# Patient Record
Sex: Male | Born: 1960 | Race: White | Hispanic: No | Marital: Single | Smoking: Current every day smoker
Health system: Southern US, Community
[De-identification: ages and names within clinical notes are randomized; demographics above are authoritative.]

## PROBLEM LIST (undated history)

## (undated) DIAGNOSIS — I350 Nonrheumatic aortic (valve) stenosis: Secondary | ICD-10-CM

## (undated) DIAGNOSIS — R0902 Hypoxemia: Secondary | ICD-10-CM

## (undated) DIAGNOSIS — R0602 Shortness of breath: Secondary | ICD-10-CM

## (undated) DIAGNOSIS — J189 Pneumonia, unspecified organism: Secondary | ICD-10-CM

## (undated) HISTORY — PX: NO PAST SURGERIES: SHX2092

## (undated) HISTORY — DX: Pneumonia, unspecified organism: J18.9

## (undated) HISTORY — PX: APPENDECTOMY: SHX54

## (undated) HISTORY — DX: Nonrheumatic aortic (valve) stenosis: I35.0

## (undated) HISTORY — DX: Hypoxemia: R09.02

## (undated) HISTORY — PX: STENT PLACEMENT VASCULAR (ARMC HX): HXRAD1737

## (undated) HISTORY — PX: OTHER SURGICAL HISTORY: SHX169

## (undated) HISTORY — DX: Shortness of breath: R06.02

## (undated) HISTORY — PX: KNEE SURGERY: SHX244

---

## 2005-05-03 ENCOUNTER — Encounter: Admission: RE | Admit: 2005-05-03 | Discharge: 2005-08-01 | Payer: Self-pay | Admitting: Orthopedic Surgery

## 2008-12-21 ENCOUNTER — Ambulatory Visit: Payer: Self-pay | Admitting: Cardiovascular Disease

## 2009-03-13 ENCOUNTER — Ambulatory Visit: Payer: Self-pay | Admitting: Cardiovascular Disease

## 2009-03-13 ENCOUNTER — Encounter: Payer: Self-pay | Admitting: Cardiovascular Disease

## 2009-03-13 DIAGNOSIS — I25110 Atherosclerotic heart disease of native coronary artery with unstable angina pectoris: Secondary | ICD-10-CM

## 2009-03-13 DIAGNOSIS — I2510 Atherosclerotic heart disease of native coronary artery without angina pectoris: Secondary | ICD-10-CM

## 2009-03-13 DIAGNOSIS — I10 Essential (primary) hypertension: Secondary | ICD-10-CM

## 2009-03-13 HISTORY — DX: Essential (primary) hypertension: I10

## 2009-03-13 HISTORY — DX: Atherosclerotic heart disease of native coronary artery with unstable angina pectoris: I25.110

## 2009-03-13 HISTORY — DX: Atherosclerotic heart disease of native coronary artery without angina pectoris: I25.10

## 2011-04-16 NOTE — Assessment & Plan Note (Signed)
[facility]                            CARDIOLOGY OFFICE NOTE   RUSH, SALCE                       MRN:          [id number]  DATE:12/21/2008                            DOB:          November 02, 1961    PRIMARY CARE PHYSICIAN:  Dr. Donnel Saxon.   PRIMARY CARDIOLOGIST:  Dr. Gypsy Balsam.   REASON FOR REFERRAL:  Patient with known coronary artery disease with  continued exertional and resting chest pain.   HISTORY OF PRESENT ILLNESS:  Mr. Hayashi is a pleasant 50 year old  Caucasian male with a past medical history significant for coronary  artery disease status post an acute myocardial infarction in October  2007, at which time, he received a Vision bare-metal stent in the mid  circumflex artery, hypertension, hyperlipidemia, GERD, and chronic back  pain, who presents today to our office for further evaluation of  substernal chest pain.  The patient is a very poor historian and is  unaware of most of his medications as well as most of his past medical  history.  I have reviewed the records that arrived with the patient from  Dr. Ardelle Park and it appears that he has been having daily exertional chest  pain for several years.  The patient tells me that in October 2007, he  was taken emergently to the [facility] with  chest pain and was found to be having a heart attack.  He was taken  emergently to the catheterization laboratory and had a stent placed.  I  was able to retrieve his stent card from the patient today that shows  that he had a Vision bare-metal stent placed in the mid circumflex  artery on September 23, 2006.  Since that time, the patient has had  complaints of daily substernal chest pain that is described as a  pressure-like sensation and occurs both at rest and with exertion.  The  pain does not radiate; however, when he has the pain, he become sweaty,  notices shortness of breath, and has numbness in both of his  arms.  He  denies any nausea or palpitations during the episodes of chest pain.  He  does not seem to have significant resting or exertional dyspnea.  He  denies any orthopnea, PND, or lower extremity edema.  He unfortunately  tells me that he has been smoking since he was 50 years old and continues  to smoke 1-1/2-2 packs of cigarettes per day.  He reports being  compliant with all his medications, however today he is not sure what  medications he is taking.  He did not bring any of his medications with  him to this appointment today.   The patient tells me that since his stent was placed in 2007, he has had  these episodes of chest pain on an almost daily basis.  It seems that he  has had a repeat left heart catheterization at some point in the last  year and was told at that time that there was no significant coronary  artery disease.  The patient reports also having a normal  stress test at  some point in the last several years.  He also has had a workup for  potential gallbladder disease that has returned as negative per the  patient and has been treated recently for gastroesophageal reflux  disease as a possible source of his chest pain.  He has seen no  significant change in his chest pain since starting the Zantac 2 months  ago.  The patient is followed apparently by Dr. Gypsy Balsam in  [facility] in [place] and had his stent placed by Dr. Janene Madeira in [facility].  The patient is unsure if he has been  seen recently by Dr. Carollee Sires, but reports having been seen in the last  6 months to a year in the office of Dr. Bing Matter.  The patient thinks  that these physicians have told him that his chest pain may be related  to coronary artery vasospasm; however, he is unsure of this.  He thinks  that he has been taking Imdur 120 mg once daily for this problem.   PAST MEDICAL HISTORY:  1. Coronary artery disease status post acute myocardial infarction in       October 2007 with placement of a Vision bare-metal stent in the mid      circumflex artery.  No records of this catheterization are      available at the current time.  2. Hypertension.  3. Hyperlipidemia.  4. GERD.  5. Chronic back pain.  6. Chronic tobacco abuse.   PAST SURGICAL HISTORY:  1. Repair of left femur fracture.  2. Appendectomy.   ALLERGIES:  No known drug allergies.   The patient reports that if he takes 325 mg of aspirin, he has a  nosebleed.  He apparently tolerates 81 mg of aspirin and takes this on a  daily basis without any nose bleeding or allergic response.   CURRENT MEDICATIONS:  We have contacted the patient's pharmacy and  founded that he is taking hydrocodone 10/500 mg every 6 hours, diazepam  10 mg 3 times daily, Zantac 300 mg once daily, isosorbide mononitrate  120 mg once daily, aspirin 81 mg once daily.  The patient also reports  taking a medication for his blood pressure and one for his cholesterol,  but he is unsure of what these medications are.   SOCIAL HISTORY:  The patient has smoked 1-1/2-2 packs of cigarettes per  day for the last 42 years.  He denies use alcohol or illicit drugs.  He  is currently unemployed.  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  He is separated from his spouse and has no children.   FAMILY HISTORY:  The patient's mother is alive, but reportedly has  coronary artery disease.  His father is deceased and reportedly had  coronary artery disease.   REVIEW OF SYSTEMS:  As stated in history of present illness and is  otherwise negative.   PHYSICAL EXAMINATION:  VITAL SIGNS:  Blood pressure 158/80, pulse 68 and  regular, respirations 12 and unlabored.  GENERAL:  He is a pleasant, thin Caucasian male in no acute distress.  He is alert and oriented x3.  PSYCHIATRIC:  Mood and affect are appropriate.  MUSCULOSKELETAL:  Muscle strength and tone is normal.  NEUROLOGIC:  No focal neurological deficits.  SKIN:   Warm and dry.  HEENT:  Normal.  NECK:  No JVD.  No carotid bruits.  No thyromegaly.  No lymphadenopathy.  LUNGS:  Clear to auscultation  bilaterally without wheezes, rhonchi, or  crackles noted.  CARDIOVASCULAR:  Regular rate and rhythm without murmurs, gallops, or  rubs noted.  ABDOMEN:  Soft, nontender, nondistended.  Bowel sounds are present.  EXTREMITIES:  No evidence of edema.  Pulses are 2+ in all extremities.   DIAGNOSTIC STUDIES:  1. A 12-lead EKG obtained in our office today show normal sinus rhythm      with a ventricular rate of 75 beats per minute.  There are no      ischemic changes noted.  2. Records of the patient's prior heart catheterizations and stress      tests are not available at this time.  We will have the patient      sign release of information forms in attempts to obtain these      records.   ASSESSMENT AND PLAN:  This is a pleasant 50 year old Caucasian male with  known coronary artery disease status post prior myocardial infarction  with placement of a bare-metal stent in the mid circumflex artery in  2007, hypertension, hyperlipidemia, GERD, and chronic back pain, who  presents for evaluation for daily exertional and resting substernal  chest pain.  The patient's chest pain has many atypical features;  however, he does have known coronary artery disease.  At this time, it  is unclear to me what has been done for him recently.  It sounds as if  he has had a recent normal stress test and normal heart catheterization.  The other physicians who have evaluated him have suspected that his  chest pain is related to a gastrointestinal problem such as GERD or is  secondary to coronary vasospasm.  His EKG today is not suggestive of any  active ischemia.  At this time, I do not have an accurate list of his  current medications or any of the prior records of his cardiac workup.  We have gotten the patient to sign a medical release forms from Dr.  Gypsy Balsam in  [facility] in [place] as well as for Dr.  Janene Madeira in [facility] in [place].  We will have  all of the records of prior cardiac workups sent to our office if  possible and will see the patient back in 3-4 weeks to review these  records and to decide on further workup at that time.  I have also  encouraged the patient to attempt to stop smoking and I have asked him  to bring all of his medication bottles with him during the next  appointment, so we can accurately chart these in our records.  I will  not make any changes in his medications at this time because I am really  unsure of what he is taking currently.  The patient is alerted that he  should call [facility] if he has any change in his  clinical status.     Verne Carrow, MD  Electronically Signed    CM/MedQ  DD: 12/21/2008  DT: 12/22/2008  Job #: [id number]   cc:   Donnel Saxon

## 2015-05-03 DIAGNOSIS — E7800 Pure hypercholesterolemia, unspecified: Secondary | ICD-10-CM | POA: Insufficient documentation

## 2015-05-03 HISTORY — DX: Pure hypercholesterolemia, unspecified: E78.00

## 2015-06-26 ENCOUNTER — Emergency Department (HOSPITAL_COMMUNITY): Payer: Medicaid Other

## 2015-06-26 ENCOUNTER — Encounter (HOSPITAL_COMMUNITY): Payer: Self-pay | Admitting: *Deleted

## 2015-06-26 ENCOUNTER — Inpatient Hospital Stay (HOSPITAL_COMMUNITY)
Admission: EM | Admit: 2015-06-26 | Discharge: 2015-07-02 | DRG: 183 | Disposition: A | Discharge status: Home Health | Payer: Medicaid Other | Attending: Internal Medicine | Admitting: Internal Medicine

## 2015-06-26 ENCOUNTER — Other Ambulatory Visit: Payer: Self-pay

## 2015-06-26 DIAGNOSIS — S2242XA Multiple fractures of ribs, left side, initial encounter for closed fracture: Principal | ICD-10-CM | POA: Diagnosis present

## 2015-06-26 DIAGNOSIS — J9811 Atelectasis: Secondary | ICD-10-CM | POA: Diagnosis not present

## 2015-06-26 DIAGNOSIS — S2249XA Multiple fractures of ribs, unspecified side, initial encounter for closed fracture: Secondary | ICD-10-CM

## 2015-06-26 DIAGNOSIS — R0989 Other specified symptoms and signs involving the circulatory and respiratory systems: Secondary | ICD-10-CM

## 2015-06-26 DIAGNOSIS — I209 Angina pectoris, unspecified: Secondary | ICD-10-CM | POA: Diagnosis present

## 2015-06-26 DIAGNOSIS — Z7902 Long term (current) use of antithrombotics/antiplatelets: Secondary | ICD-10-CM

## 2015-06-26 DIAGNOSIS — T1490XA Injury, unspecified, initial encounter: Secondary | ICD-10-CM

## 2015-06-26 DIAGNOSIS — I10 Essential (primary) hypertension: Secondary | ICD-10-CM | POA: Diagnosis present

## 2015-06-26 DIAGNOSIS — I5032 Chronic diastolic (congestive) heart failure: Secondary | ICD-10-CM | POA: Diagnosis present

## 2015-06-26 DIAGNOSIS — R0789 Other chest pain: Secondary | ICD-10-CM | POA: Diagnosis present

## 2015-06-26 DIAGNOSIS — R079 Chest pain, unspecified: Secondary | ICD-10-CM | POA: Diagnosis present

## 2015-06-26 DIAGNOSIS — J189 Pneumonia, unspecified organism: Secondary | ICD-10-CM | POA: Insufficient documentation

## 2015-06-26 DIAGNOSIS — E785 Hyperlipidemia, unspecified: Secondary | ICD-10-CM | POA: Diagnosis present

## 2015-06-26 DIAGNOSIS — K5900 Constipation, unspecified: Secondary | ICD-10-CM | POA: Diagnosis present

## 2015-06-26 DIAGNOSIS — Y92410 Unspecified street and highway as the place of occurrence of the external cause: Secondary | ICD-10-CM

## 2015-06-26 DIAGNOSIS — R0902 Hypoxemia: Secondary | ICD-10-CM | POA: Insufficient documentation

## 2015-06-26 DIAGNOSIS — Z79899 Other long term (current) drug therapy: Secondary | ICD-10-CM

## 2015-06-26 DIAGNOSIS — J9600 Acute respiratory failure, unspecified whether with hypoxia or hypercapnia: Secondary | ICD-10-CM | POA: Diagnosis present

## 2015-06-26 DIAGNOSIS — J9601 Acute respiratory failure with hypoxia: Secondary | ICD-10-CM | POA: Diagnosis present

## 2015-06-26 DIAGNOSIS — D72829 Elevated white blood cell count, unspecified: Secondary | ICD-10-CM | POA: Diagnosis present

## 2015-06-26 DIAGNOSIS — S2239XA Fracture of one rib, unspecified side, initial encounter for closed fracture: Secondary | ICD-10-CM | POA: Diagnosis present

## 2015-06-26 DIAGNOSIS — S2232XA Fracture of one rib, left side, initial encounter for closed fracture: Secondary | ICD-10-CM | POA: Diagnosis present

## 2015-06-26 DIAGNOSIS — J181 Lobar pneumonia, unspecified organism: Secondary | ICD-10-CM

## 2015-06-26 DIAGNOSIS — R0602 Shortness of breath: Secondary | ICD-10-CM | POA: Insufficient documentation

## 2015-06-26 DIAGNOSIS — I2510 Atherosclerotic heart disease of native coronary artery without angina pectoris: Secondary | ICD-10-CM | POA: Diagnosis present

## 2015-06-26 HISTORY — DX: Multiple fractures of ribs, unspecified side, initial encounter for closed fracture: S22.49XA

## 2015-06-26 HISTORY — DX: Chest pain, unspecified: R07.9

## 2015-06-26 HISTORY — DX: Other chest pain: R07.89

## 2015-06-26 LAB — ETHANOL: Alcohol, Ethyl (B): <5 mg/dL (ref <5)

## 2015-06-26 LAB — COMPREHENSIVE METABOLIC PANEL
ALBUMIN: 4.2 g/dL (ref 3.5–5.0)
ALT: 32 U/L (ref 17–63)
AST: 34 U/L (ref 15–41)
Alkaline Phosphatase: 72 U/L (ref 38–126)
Anion gap: 7 (ref 5–15)
BILIRUBIN TOTAL: 0.8 mg/dL (ref 0.3–1.2)
BUN: 20 mg/dL (ref 6–20)
CHLORIDE: 110 mmol/L (ref 101–111)
CO2: 25 mmol/L (ref 22–32)
Calcium: 9.3 mg/dL (ref 8.9–10.3)
Creatinine, Ser: 1.01 mg/dL (ref 0.61–1.24)
GFR calc Af Amer: >60 mL/min (ref >60)
GFR calc non Af Amer: >60 mL/min (ref >60)
Glucose, Bld: 106 mg/dL — ABNORMAL HIGH (ref 65–99)
POTASSIUM: 3.9 mmol/L (ref 3.5–5.1)
SODIUM: 142 mmol/L (ref 135–145)
Total Protein: 6.9 g/dL (ref 6.5–8.1)

## 2015-06-26 LAB — CBC
HCT: 45.1 % (ref 39.0–52.0)
HEMOGLOBIN: 15.4 g/dL (ref 13.0–17.0)
MCH: 29.2 pg (ref 26.0–34.0)
MCHC: 34.1 g/dL (ref 30.0–36.0)
MCV: 85.6 fL (ref 78.0–100.0)
Platelets: 176 10*3/uL (ref 150–400)
RBC: 5.27 MIL/uL (ref 4.22–5.81)
RDW: 13.9 % (ref 11.5–15.5)
WBC: 13.4 10*3/uL — ABNORMAL HIGH (ref 4.0–10.5)

## 2015-06-26 LAB — PROTIME-INR
INR: 1.04 (ref 0.00–1.49)
Prothrombin Time: 13.8 seconds (ref 11.6–15.2)

## 2015-06-26 LAB — TROPONIN I
Troponin I: <0.03 ng/mL (ref <0.031)
Troponin I: <0.03 ng/mL (ref <0.031)

## 2015-06-26 LAB — SAMPLE TO BLOOD BANK

## 2015-06-26 LAB — CDS SEROLOGY

## 2015-06-26 MED ORDER — ONDANSETRON HCL 4 MG/2ML IJ SOLN
4.0000 mg | Freq: Once | INTRAMUSCULAR | Status: AC
  Administered 2015-06-26: 4 mg via INTRAVENOUS
  Filled 2015-06-26: qty 2

## 2015-06-26 MED ORDER — IOHEXOL 300 MG/ML  SOLN
100.0000 mL | Freq: Once | INTRAMUSCULAR | Status: AC | PRN
  Administered 2015-06-26: 100 mL via INTRAVENOUS

## 2015-06-26 MED ORDER — HYDROMORPHONE HCL 1 MG/ML IJ SOLN
1.0000 mg | Freq: Once | INTRAMUSCULAR | Status: AC
  Administered 2015-06-26: 1 mg via INTRAVENOUS
  Filled 2015-06-26: qty 1

## 2015-06-26 MED ORDER — ASPIRIN 81 MG PO CHEW
324.0000 mg | CHEWABLE_TABLET | Freq: Once | ORAL | Status: AC
  Administered 2015-06-26: 324 mg via ORAL
  Filled 2015-06-26: qty 4

## 2015-06-26 MED ORDER — HYDROCODONE-ACETAMINOPHEN 5-325 MG PO TABS
1.0000 | ORAL_TABLET | Freq: Four times a day (QID) | ORAL | Status: DC | PRN
  Administered 2015-06-26: 1 via ORAL
  Filled 2015-06-26: qty 1

## 2015-06-26 NOTE — ED Notes (Signed)
Pt back from x-ray.
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
## 2015-06-26 NOTE — ED Notes (Signed)
Pt repositioned in bed and provided with a [place] sandwich bag and Coke to drink, okay'd by MD
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
## 2015-06-26 NOTE — ED Notes (Signed)
Phlebotomy at bedside.
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
## 2015-06-27 ENCOUNTER — Encounter (HOSPITAL_COMMUNITY): Payer: Self-pay | Admitting: *Deleted

## 2015-06-27 DIAGNOSIS — S2232XA Fracture of one rib, left side, initial encounter for closed fracture: Secondary | ICD-10-CM

## 2015-06-27 DIAGNOSIS — R079 Chest pain, unspecified: Secondary | ICD-10-CM | POA: Diagnosis not present

## 2015-06-27 HISTORY — DX: Fracture of one rib, left side, initial encounter for closed fracture: S22.32XA

## 2015-06-27 LAB — MAGNESIUM: Magnesium: 2.1 mg/dL (ref 1.7–2.4)

## 2015-06-27 LAB — TROPONIN I: Troponin I: <0.03 ng/mL (ref <0.031)

## 2015-06-27 MED ORDER — OXYCODONE HCL 5 MG PO TABS
5.0000 mg | ORAL_TABLET | ORAL | Status: DC | PRN
  Administered 2015-06-27 (×3): 5 mg via ORAL
  Administered 2015-06-28 – 2015-07-02 (×10): 10 mg via ORAL
  Filled 2015-06-27 (×2): qty 2
  Filled 2015-06-27: qty 1
  Filled 2015-06-27 (×7): qty 2
  Filled 2015-06-27: qty 3
  Filled 2015-06-27: qty 2
  Filled 2015-06-27: qty 1

## 2015-06-27 MED ORDER — IPRATROPIUM-ALBUTEROL 0.5-2.5 (3) MG/3ML IN SOLN
3.0000 mL | Freq: Four times a day (QID) | RESPIRATORY_TRACT | Status: DC
  Administered 2015-06-27: 3 mL via RESPIRATORY_TRACT
  Filled 2015-06-27: qty 3

## 2015-06-27 MED ORDER — ATORVASTATIN CALCIUM 80 MG PO TABS
80.0000 mg | ORAL_TABLET | Freq: Every day | ORAL | Status: DC
  Administered 2015-06-27 – 2015-07-02 (×6): 80 mg via ORAL
  Filled 2015-06-27 (×6): qty 1

## 2015-06-27 MED ORDER — EZETIMIBE 10 MG PO TABS
10.0000 mg | ORAL_TABLET | Freq: Every day | ORAL | Status: DC
  Administered 2015-06-27 – 2015-07-02 (×6): 10 mg via ORAL
  Filled 2015-06-27 (×6): qty 1

## 2015-06-27 MED ORDER — IPRATROPIUM-ALBUTEROL 0.5-2.5 (3) MG/3ML IN SOLN
3.0000 mL | RESPIRATORY_TRACT | Status: DC | PRN
  Administered 2015-06-28 (×2): 3 mL via RESPIRATORY_TRACT
  Filled 2015-06-27 (×2): qty 3

## 2015-06-27 MED ORDER — METOPROLOL TARTRATE 25 MG PO TABS
25.0000 mg | ORAL_TABLET | Freq: Two times a day (BID) | ORAL | Status: DC
Start: 2015-06-27 — End: 2015-06-27
  Administered 2015-06-27 (×2): 25 mg via ORAL
  Filled 2015-06-27 (×3): qty 1

## 2015-06-27 MED ORDER — SODIUM CHLORIDE 0.9 % IJ SOLN
3.0000 mL | Freq: Two times a day (BID) | INTRAMUSCULAR | Status: DC
  Administered 2015-06-27 – 2015-07-02 (×12): 3 mL via INTRAVENOUS

## 2015-06-27 MED ORDER — CLOPIDOGREL BISULFATE 75 MG PO TABS
75.0000 mg | ORAL_TABLET | Freq: Every day | ORAL | Status: DC
  Administered 2015-06-27 – 2015-07-02 (×6): 75 mg via ORAL
  Filled 2015-06-27 (×7): qty 1

## 2015-06-27 MED ORDER — ENOXAPARIN SODIUM 40 MG/0.4ML ~~LOC~~ SOLN
40.0000 mg | SUBCUTANEOUS | Status: DC
  Administered 2015-06-27 – 2015-07-02 (×6): 40 mg via SUBCUTANEOUS
  Filled 2015-06-27 (×6): qty 0.4

## 2015-06-27 MED ORDER — METHOCARBAMOL 500 MG PO TABS
1000.0000 mg | ORAL_TABLET | Freq: Three times a day (TID) | ORAL | Status: DC | PRN
  Filled 2015-06-27: qty 2

## 2015-06-27 MED ORDER — ONDANSETRON HCL 4 MG/2ML IJ SOLN
4.0000 mg | Freq: Four times a day (QID) | INTRAMUSCULAR | Status: DC | PRN
  Administered 2015-06-27 – 2015-06-30 (×4): 4 mg via INTRAVENOUS
  Filled 2015-06-27 (×5): qty 2

## 2015-06-27 MED ORDER — HYDROCODONE-ACETAMINOPHEN 5-325 MG PO TABS: 1.0000 | ORAL_TABLET | Freq: Four times a day (QID) | ORAL | Status: DC | PRN

## 2015-06-27 MED ORDER — ACETAMINOPHEN 325 MG PO TABS
650.0000 mg | ORAL_TABLET | ORAL | Status: DC | PRN
  Administered 2015-06-30: 650 mg via ORAL
  Filled 2015-06-27: qty 2

## 2015-06-27 MED ORDER — PANTOPRAZOLE SODIUM 40 MG PO TBEC
40.0000 mg | DELAYED_RELEASE_TABLET | Freq: Every day | ORAL | Status: DC
Start: 2015-06-27 — End: 2015-07-02
  Administered 2015-06-27 – 2015-07-02 (×6): 40 mg via ORAL
  Filled 2015-06-27 (×6): qty 1

## 2015-06-27 MED ORDER — CETYLPYRIDINIUM CHLORIDE 0.05 % MT LIQD
7.0000 mL | Freq: Two times a day (BID) | OROMUCOSAL | Status: DC
  Administered 2015-06-27 – 2015-07-02 (×7): 7 mL via OROMUCOSAL

## 2015-06-27 MED ORDER — METOPROLOL TARTRATE 50 MG PO TABS
50.0000 mg | ORAL_TABLET | Freq: Two times a day (BID) | ORAL | Status: DC
  Administered 2015-06-27 – 2015-06-28 (×2): 50 mg via ORAL
  Filled 2015-06-27 (×3): qty 1

## 2015-06-27 MED ORDER — HYDROMORPHONE HCL 1 MG/ML IJ SOLN
1.0000 mg | INTRAMUSCULAR | Status: DC | PRN
  Administered 2015-06-28 (×2): 1 mg via INTRAVENOUS
  Filled 2015-06-27 (×2): qty 1

## 2015-06-27 MED ORDER — NITROGLYCERIN 0.4 MG SL SUBL: 0.4000 mg | SUBLINGUAL_TABLET | SUBLINGUAL | Status: DC | PRN

## 2015-06-27 NOTE — ED Notes (Signed)
Spoke to the admitting Doctor about patient's blood pressure. Will order blood medication for floor and able to transport patient to floor.
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
## 2015-06-27 NOTE — Progress Notes (Signed)
Pt was provided with incentive spirometer and was instructed on its use.

## 2015-06-27 NOTE — Progress Notes (Signed)
Pt O2 sat down to 88/89% on 2Lnc, Pt O2 sat increased to 91/92% but no higher on 3-6L. RT notified to come assess pt. Pt in NAD and comfortable in bed, achieved on IS, refusing to take deep breaths, will only perform weak cough, d/t pain from left sided rib fractures. Pt educated on importance of coughing and deep breathing and using IS. Pt verbalized understanding but states that he can't. Pt states he has a history of COPD and is a current smoker.   K Kirby notified and new order for O2 sat parameters.

## 2015-06-27 NOTE — ED Notes (Addendum)
Spoke with floor requested to speak to admit Doctor regarding blood pressure. Paged the admitting Doctor.
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
## 2015-06-27 NOTE — Progress Notes (Signed)
Pt arrived to floor around 1AM, c/o pain to left chest when coughing, pt states it only hurts with movement and coughing. BP elevated. Pt given scheduled BP medicine. Pt resting comfortably in bed at this time. Will continue to monitor. Huel Coventry, RN

## 2015-06-27 NOTE — Progress Notes (Signed)
Reinforced incentive spirometer teaching to pt.  As per MDO, I instructed pt to use IS each hour, with a goal of .

## 2015-06-28 ENCOUNTER — Observation Stay (HOSPITAL_COMMUNITY): Payer: Medicaid Other

## 2015-06-28 DIAGNOSIS — S2232XA Fracture of one rib, left side, initial encounter for closed fracture: Secondary | ICD-10-CM | POA: Diagnosis not present

## 2015-06-28 DIAGNOSIS — I10 Essential (primary) hypertension: Secondary | ICD-10-CM

## 2015-06-28 DIAGNOSIS — E785 Hyperlipidemia, unspecified: Secondary | ICD-10-CM

## 2015-06-28 DIAGNOSIS — R079 Chest pain, unspecified: Secondary | ICD-10-CM | POA: Diagnosis not present

## 2015-06-28 DIAGNOSIS — D72829 Elevated white blood cell count, unspecified: Secondary | ICD-10-CM

## 2015-06-28 DIAGNOSIS — S2232XD Fracture of one rib, left side, subsequent encounter for fracture with routine healing: Secondary | ICD-10-CM | POA: Diagnosis not present

## 2015-06-28 DIAGNOSIS — R0902 Hypoxemia: Secondary | ICD-10-CM | POA: Diagnosis not present

## 2015-06-28 LAB — CBC WITH DIFFERENTIAL/PLATELET
Basophils Absolute: 0 10*3/uL (ref 0.0–0.1)
Basophils Relative: 0 % (ref 0–1)
EOS ABS: 0 10*3/uL (ref 0.0–0.7)
EOS PCT: 0 % (ref 0–5)
HCT: 49.8 % (ref 39.0–52.0)
Hemoglobin: 17 g/dL (ref 13.0–17.0)
LYMPHS ABS: 2.2 10*3/uL (ref 0.7–4.0)
Lymphocytes Relative: 10 % — ABNORMAL LOW (ref 12–46)
MCH: 28.9 pg (ref 26.0–34.0)
MCHC: 34.1 g/dL (ref 30.0–36.0)
MCV: 84.6 fL (ref 78.0–100.0)
Monocytes Absolute: 2.1 10*3/uL — ABNORMAL HIGH (ref 0.1–1.0)
Monocytes Relative: 10 % (ref 3–12)
NEUTROS ABS: 17.5 10*3/uL — AB (ref 1.7–7.7)
Neutrophils Relative %: 81 % — ABNORMAL HIGH (ref 43–77)
PLATELETS: 194 10*3/uL (ref 150–400)
RBC: 5.89 MIL/uL — AB (ref 4.22–5.81)
RDW: 14 % (ref 11.5–15.5)
WBC: 21.7 10*3/uL — ABNORMAL HIGH (ref 4.0–10.5)

## 2015-06-28 LAB — BASIC METABOLIC PANEL
Anion gap: 10 (ref 5–15)
Anion gap: 12 (ref 5–15)
BUN: 18 mg/dL (ref 6–20)
BUN: 30 mg/dL — ABNORMAL HIGH (ref 6–20)
CALCIUM: 9.3 mg/dL (ref 8.9–10.3)
CHLORIDE: 104 mmol/L (ref 101–111)
CO2: 24 mmol/L (ref 22–32)
CO2: 28 mmol/L (ref 22–32)
CREATININE: 1.14 mg/dL (ref 0.61–1.24)
Calcium: 8.9 mg/dL (ref 8.9–10.3)
Chloride: 97 mmol/L — ABNORMAL LOW (ref 101–111)
Creatinine, Ser: 0.97 mg/dL (ref 0.61–1.24)
GFR calc Af Amer: >60 mL/min (ref >60)
GFR calc Af Amer: >60 mL/min (ref >60)
GFR calc non Af Amer: >60 mL/min (ref >60)
GFR calc non Af Amer: >60 mL/min (ref >60)
Glucose, Bld: 100 mg/dL — ABNORMAL HIGH (ref 65–99)
Glucose, Bld: 135 mg/dL — ABNORMAL HIGH (ref 65–99)
POTASSIUM: 4.2 mmol/L (ref 3.5–5.1)
Potassium: 4.6 mmol/L (ref 3.5–5.1)
Sodium: 138 mmol/L (ref 135–145)
Sodium: 137 mmol/L (ref 135–145)

## 2015-06-28 LAB — URINALYSIS, ROUTINE W REFLEX MICROSCOPIC
Bilirubin Urine: NEGATIVE
Glucose, UA: NEGATIVE mg/dL
KETONES UR: NEGATIVE mg/dL
Leukocytes, UA: NEGATIVE
Nitrite: NEGATIVE
PH: 6 (ref 5.0–8.0)
Protein, ur: NEGATIVE mg/dL
Specific Gravity, Urine: 1.007 (ref 1.005–1.030)
Urobilinogen, UA: 0.2 mg/dL (ref 0.0–1.0)

## 2015-06-28 LAB — CBC
HCT: 48.6 % (ref 39.0–52.0)
Hemoglobin: 16.5 g/dL (ref 13.0–17.0)
MCH: 29.2 pg (ref 26.0–34.0)
MCHC: 34 g/dL (ref 30.0–36.0)
MCV: 86 fL (ref 78.0–100.0)
PLATELETS: 178 10*3/uL (ref 150–400)
RBC: 5.65 MIL/uL (ref 4.22–5.81)
RDW: 14.2 % (ref 11.5–15.5)
WBC: 23.9 10*3/uL — ABNORMAL HIGH (ref 4.0–10.5)

## 2015-06-28 LAB — URINE MICROSCOPIC-ADD ON

## 2015-06-28 LAB — LIPID PANEL
CHOL/HDL RATIO: 4.1 ratio
Cholesterol: 150 mg/dL (ref 0–200)
HDL: 37 mg/dL — ABNORMAL LOW (ref >40)
LDL CALC: 92 mg/dL (ref 0–99)
TRIGLYCERIDES: 103 mg/dL (ref <150)
VLDL: 21 mg/dL (ref 0–40)

## 2015-06-28 LAB — LACTIC ACID, PLASMA: Lactic Acid, Venous: 1.9 mmol/L (ref 0.5–2.0)

## 2015-06-28 MED ORDER — HYDROMORPHONE HCL 1 MG/ML IJ SOLN: 1.0000 mg | INTRAMUSCULAR | Status: DC | PRN

## 2015-06-28 MED ORDER — LEVOFLOXACIN IN D5W 750 MG/150ML IV SOLN
750.0000 mg | Freq: Every day | INTRAVENOUS | Status: DC
  Administered 2015-06-28: 750 mg via INTRAVENOUS
  Filled 2015-06-28: qty 150

## 2015-06-28 MED ORDER — FUROSEMIDE 10 MG/ML IJ SOLN
40.0000 mg | Freq: Once | INTRAMUSCULAR | Status: AC
  Administered 2015-06-29: 40 mg via INTRAVENOUS
  Filled 2015-06-28: qty 4

## 2015-06-28 MED ORDER — METOPROLOL TARTRATE 1 MG/ML IV SOLN: 2.5000 mg | Freq: Four times a day (QID) | INTRAVENOUS | Status: DC

## 2015-06-28 MED ORDER — METOPROLOL TARTRATE 1 MG/ML IV SOLN
10.0000 mg | Freq: Once | INTRAVENOUS | Status: AC
  Administered 2015-06-28: 10 mg via INTRAVENOUS

## 2015-06-28 MED ORDER — METOPROLOL TARTRATE 1 MG/ML IV SOLN
5.0000 mg | Freq: Four times a day (QID) | INTRAVENOUS | Status: DC
  Administered 2015-06-28 – 2015-07-02 (×15): 5 mg via INTRAVENOUS
  Filled 2015-06-28 (×20): qty 5

## 2015-06-28 MED ORDER — FUROSEMIDE 10 MG/ML IJ SOLN
40.0000 mg | Freq: Once | INTRAMUSCULAR | Status: DC
  Filled 2015-06-28: qty 4

## 2015-06-28 MED ORDER — POLYETHYLENE GLYCOL 3350 17 G PO PACK
17.0000 g | PACK | Freq: Every day | ORAL | Status: DC
  Administered 2015-06-28 – 2015-07-02 (×3): 17 g via ORAL
  Filled 2015-06-28 (×5): qty 1

## 2015-06-28 MED ORDER — MORPHINE SULFATE 2 MG/ML IJ SOLN: 2.0000 mg | INTRAMUSCULAR | Status: DC | PRN

## 2015-06-28 MED ORDER — METOPROLOL TARTRATE 1 MG/ML IV SOLN
INTRAVENOUS | Status: AC
  Administered 2015-06-28: 21:00:00
  Filled 2015-06-28: qty 10

## 2015-06-28 MED ORDER — FUROSEMIDE 10 MG/ML IJ SOLN
INTRAMUSCULAR | Status: AC
  Filled 2015-06-28: qty 8

## 2015-06-28 MED ORDER — GUAIFENESIN ER 600 MG PO TB12
600.0000 mg | ORAL_TABLET | Freq: Two times a day (BID) | ORAL | Status: DC
  Administered 2015-06-28 – 2015-07-02 (×10): 600 mg via ORAL
  Filled 2015-06-28 (×12): qty 1

## 2015-06-28 MED ORDER — BISACODYL 10 MG RE SUPP
10.0000 mg | Freq: Once | RECTAL | Status: AC
  Administered 2015-06-28: 10 mg via RECTAL
  Filled 2015-06-28: qty 1

## 2015-06-28 MED ORDER — LEVOFLOXACIN IN D5W 750 MG/150ML IV SOLN
750.0000 mg | INTRAVENOUS | Status: DC
  Administered 2015-06-28 – 2015-07-01 (×3): 750 mg via INTRAVENOUS
  Filled 2015-06-28 (×6): qty 150

## 2015-06-28 MED ORDER — HYDRALAZINE HCL 20 MG/ML IJ SOLN
10.0000 mg | INTRAMUSCULAR | Status: DC | PRN
  Administered 2015-06-28: 10 mg via INTRAVENOUS
  Filled 2015-06-28: qty 1

## 2015-06-28 MED ORDER — LORAZEPAM 2 MG/ML IJ SOLN: 0.5000 mg | INTRAMUSCULAR | Status: DC | PRN

## 2015-06-28 MED ORDER — LORAZEPAM 2 MG/ML IJ SOLN
1.0000 mg | Freq: Once | INTRAMUSCULAR | Status: DC
  Filled 2015-06-28: qty 1

## 2015-06-28 MED ORDER — FUROSEMIDE 10 MG/ML IJ SOLN
80.0000 mg | Freq: Once | INTRAMUSCULAR | Status: AC
  Administered 2015-06-28: 80 mg via INTRAVENOUS
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
## 2015-06-28 NOTE — Progress Notes (Signed)
Called by day shift RN at 1940 concerning Pt with low 02 sats 88-91 on 4 LNC, with "full" lungs.  Per RN Pt not in distress at all. MD notified earlier this afternoon and CXR ordered at 1844. RN advised to change CXR to STAT order and titrate Pt oxygen to maintain sats above 90%. Per Pt he has a history of COPD. Upon my arrival at 26 [facility] at bedside assessing Patient. CXR completed and reviewed per NP. Lasix ordered, 80 mg IVP given. Pt found resting in bed, alert oriented x4, denies chest pain at this time. BP elevated 210/109, HR 94 NSR  10 mg Lopresor given. P02 titrated to 100% NRB, po2 94-96 on mask but he quickly drops to 70% when removed. RR 20s.  Lungs course with wet crackles heard throughout. SDU bed obtained and Pt transferred to 2 H16 Stat for possible Bipap and closer monitoring.
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
## 2015-06-28 NOTE — Progress Notes (Signed)
  Echocardiogram 2D Echocardiogram has been performed.  Cathie Beams 06/28/2015, 12:22 PM
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
## 2015-06-28 NOTE — Procedures (Signed)
RT arrived to find pt on NRB with  pt's RN, rapid RN, and NP at bedside.  Verbal order to hold ABG and BiPAP by Kirby-Gram, NP.  Pt will be moved to 2H16.  Unit RT called and informed about pt.

## 2015-06-28 NOTE — Progress Notes (Signed)
Pt shallow breathing, unable to take deep breathe and cough. pt states he feels sleepy and unable to stay awake to deep breath. Pt already given dilaudid for 9/10 pain this AM. K Craige Cotta paged, continuous pulse ox and dinamap on finger reading in 80s.  K kirby to bedside. Pt with new O2 probe to ear, reading 90-92 on 4Lnc right now.. Blood gas cancelled. NP aware of BP and temp.   Pt in NAD at this time. Will continue to monitor. Huel Coventry, RN

## 2015-06-28 NOTE — Progress Notes (Signed)
At beginning of shift during shift change, staff nurse summoned nurse to the room [phone])  When I arrived into patient's room, without a stethoscope, could hear that patient sounded very wet. Auscultated lungs to confirm and it was definite. Patient did not look well and oxygen saturations were dropping to the low 80's on 4 liters of oxygen. Notified Rapid Res[pmse and on-call hospitalist. Venti mask applied to patient and oxygen saturations elevated to the 90's. Auscultated abdomen and it sounded like fluid just moving all around. Patient stated he still had difficulty breathing even with Venti mask.  of IV Lasix and  of IV Metoprolol given STAT per doctors order. By that time the doctor had decided for patient to be transferred to [facility]. Patient was transferred to 2H16. Report given to nurse and will further care for patient there.

## 2015-06-28 NOTE — Progress Notes (Addendum)
RN, Sharyl Nimrod, paged this NP secondary to pt having one episode of O2 sat 86% up to 93% with O2 increase to 6L. RN reports pt is trying to do IS and deep breathe, but with rib fxs, it has been difficult. Pt now with productive cough, rhonchi. CXR showed new opacity RLL ? PNA vs. Contusion. Given new cough with yellow mucus and fever, will start tx for PNA with Levaquin. Mucinex. Encourage TCDB, IS q 1hr. Duoneb prn. Continue ice/pain meds for rib fxs. Other VSS. Continuous pulse Ox for now. Stat CBC to recheck WBCC. No hemoptysis.  Jimmye Norman, NP [facility] Update: pt desatted to 84% even on venti mask this am. More sleepy. Advanced to NRB. Ordered ABG and NP to bedside. When NP arrived, RN says she has switched O2 sat probe to ear and now O2 sat on NRB is 98%.  S: Pt doesn't feel SOB. Says he feels "fine". Admits to shallow breathing because of rib cage pain. No hemoptysis. O: Alert. Oriented. Appears well. No distress. BP up a little. This NP switched him back to 4L [place] while present and O2 sat staying 91-92%. He is shallow breathing but no increased WOB. No cyanosis noted. Able to speak in sentences without distress. RT in room says lungs sound fine.  A/P: 1. Hypoxia-I doubt the 84%. Likely the probe. Continue on 4L for now and increase to keep O2 sats over 90-91%. Reviewed with pt deep breaths and splinting ribs to help aerate lungs. Now that writer sees pt, he doesn't appear in distress. Writer believes fever and leukocytosis likely due to reaction process and not PNA. Atelectasis likely playing a role as well. New opacity on CXR is likely just contusion. Do not feel he needs abx. D/c Levaquin. Continue supportive care. Reported off to attending.  Jimmye Norman, NP [facility]

## 2015-06-28 NOTE — Progress Notes (Signed)
O2 sat 84-90% on 6Lnc. Pt sweaty, temp 100.8 oral. RR 20-30, pt coughing up more thick yellow mucous, rhonchi throughout. K kirby notified.   New order for chest xray, performed. Pt sweating, pt turned in bed and bed changed and dilaudid given, pt performed IS and instructed to use every hour and continue to cough. Suction set up and pt instructed on how to use for secretions. Temp re-check 99.2. Will continue to monitor. Huel Coventry, RN

## 2015-06-28 NOTE — Progress Notes (Signed)
Patient sounded very congested and full, O2 sat in the low 80's. Breathing treatment given to patient, patient instructed on how and encouraged to use  incentive spirometer. MD notified.

## 2015-06-29 DIAGNOSIS — S2242XA Multiple fractures of ribs, left side, initial encounter for closed fracture: Secondary | ICD-10-CM | POA: Diagnosis present

## 2015-06-29 DIAGNOSIS — I2510 Atherosclerotic heart disease of native coronary artery without angina pectoris: Secondary | ICD-10-CM | POA: Diagnosis present

## 2015-06-29 DIAGNOSIS — Z7902 Long term (current) use of antithrombotics/antiplatelets: Secondary | ICD-10-CM | POA: Diagnosis not present

## 2015-06-29 DIAGNOSIS — D72829 Elevated white blood cell count, unspecified: Secondary | ICD-10-CM | POA: Diagnosis present

## 2015-06-29 DIAGNOSIS — R079 Chest pain, unspecified: Secondary | ICD-10-CM | POA: Diagnosis present

## 2015-06-29 DIAGNOSIS — J9600 Acute respiratory failure, unspecified whether with hypoxia or hypercapnia: Secondary | ICD-10-CM | POA: Diagnosis present

## 2015-06-29 DIAGNOSIS — E785 Hyperlipidemia, unspecified: Secondary | ICD-10-CM | POA: Diagnosis present

## 2015-06-29 DIAGNOSIS — J189 Pneumonia, unspecified organism: Secondary | ICD-10-CM | POA: Diagnosis not present

## 2015-06-29 DIAGNOSIS — J9811 Atelectasis: Secondary | ICD-10-CM | POA: Diagnosis not present

## 2015-06-29 DIAGNOSIS — I5032 Chronic diastolic (congestive) heart failure: Secondary | ICD-10-CM

## 2015-06-29 DIAGNOSIS — Y92410 Unspecified street and highway as the place of occurrence of the external cause: Secondary | ICD-10-CM | POA: Diagnosis not present

## 2015-06-29 DIAGNOSIS — J9601 Acute respiratory failure with hypoxia: Secondary | ICD-10-CM

## 2015-06-29 DIAGNOSIS — S2232XD Fracture of one rib, left side, subsequent encounter for fracture with routine healing: Secondary | ICD-10-CM

## 2015-06-29 DIAGNOSIS — R0902 Hypoxemia: Secondary | ICD-10-CM | POA: Diagnosis not present

## 2015-06-29 DIAGNOSIS — I209 Angina pectoris, unspecified: Secondary | ICD-10-CM | POA: Diagnosis present

## 2015-06-29 DIAGNOSIS — Z79899 Other long term (current) drug therapy: Secondary | ICD-10-CM | POA: Diagnosis not present

## 2015-06-29 DIAGNOSIS — I10 Essential (primary) hypertension: Secondary | ICD-10-CM | POA: Diagnosis present

## 2015-06-29 DIAGNOSIS — K5900 Constipation, unspecified: Secondary | ICD-10-CM | POA: Diagnosis present

## 2015-06-29 HISTORY — DX: Acute respiratory failure, unspecified whether with hypoxia or hypercapnia: J96.00

## 2015-06-29 HISTORY — DX: Acute respiratory failure with hypoxia: J96.01

## 2015-06-29 LAB — MRSA PCR SCREENING: MRSA by PCR: NEGATIVE

## 2015-06-29 MED ORDER — ISOSORB DINITRATE-HYDRALAZINE 20-37.5 MG PO TABS
1.0000 | ORAL_TABLET | Freq: Two times a day (BID) | ORAL | Status: DC
  Administered 2015-06-29 – 2015-06-30 (×3): 1 via ORAL
  Filled 2015-06-29 (×4): qty 1

## 2015-06-29 MED ORDER — HYDRALAZINE HCL 20 MG/ML IJ SOLN
10.0000 mg | Freq: Once | INTRAMUSCULAR | Status: AC
  Administered 2015-06-29: 10 mg via INTRAVENOUS

## 2015-06-29 MED ORDER — FUROSEMIDE 40 MG PO TABS
40.0000 mg | ORAL_TABLET | Freq: Every day | ORAL | Status: DC
  Administered 2015-06-29 – 2015-07-02 (×4): 40 mg via ORAL
  Filled 2015-06-29 (×4): qty 1

## 2015-06-29 MED ORDER — HYDRALAZINE HCL 20 MG/ML IJ SOLN
20.0000 mg | INTRAMUSCULAR | Status: DC | PRN
  Administered 2015-06-29: 20 mg via INTRAVENOUS
  Filled 2015-06-29: qty 1

## 2015-06-29 NOTE — Progress Notes (Addendum)
At 1015 Pt coughed so hard that he vomitted, approx 100-200cc emesis appears like brown burgundy water --like old blood in water. Note dark brown sediment in it.  Pt denies nausea with this emesis. ( pt had apple juice and little coffee this am at breakfast, did not eat cereal)  Next time he vomitted approx 50cc same liquid, he then stated he had not coughed that time and was nauseated some. zofran IV given. Will contact Dr Gwenlyn Perking, message sent to him.

## 2015-06-29 NOTE — Progress Notes (Signed)
Coughed up yellow white thick sputum, gagged like getting up. Pt on mucinex to help loosen up secretions.
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
## 2015-06-29 NOTE — Progress Notes (Signed)
Dr Gwenlyn Perking returned call, he noted my progress notes but had not received texted messages sent thru [facility]. Dr Gwenlyn Perking stated it was okay to give pt his plavix and lovenox. Will notify if vomitting continues and or if has bright red blood in emesis.
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
## 2015-06-29 NOTE — Progress Notes (Signed)
No further vomitting this evening, sipped on water and sprite. Up to chair this evening for approx an hour, tolerated well. sats maintained on 5L/[place].

## 2015-06-29 NOTE — Progress Notes (Signed)
Pt had 100cc brown green emesis at this time. Pt denied nausea at this time.

## 2015-06-29 NOTE — Progress Notes (Addendum)
No other emesis, no return call or orders placed by Dr Gwenlyn Perking.   Will send second message to Dr Gwenlyn Perking, also inform pt's plavix and lovenox held this am til speak with MD, due to questionable blood in emesis.

## 2015-06-30 DIAGNOSIS — R0602 Shortness of breath: Secondary | ICD-10-CM

## 2015-06-30 DIAGNOSIS — R0902 Hypoxemia: Secondary | ICD-10-CM

## 2015-06-30 MED ORDER — ISOSORB DINITRATE-HYDRALAZINE 20-37.5 MG PO TABS
1.0000 | ORAL_TABLET | Freq: Three times a day (TID) | ORAL | Status: DC
  Administered 2015-06-30 – 2015-07-02 (×6): 1 via ORAL
  Filled 2015-06-30 (×8): qty 1

## 2015-06-30 MED ORDER — BACLOFEN 10 MG PO TABS
10.0000 mg | ORAL_TABLET | Freq: Three times a day (TID) | ORAL | Status: DC
  Administered 2015-06-30 – 2015-07-02 (×6): 10 mg via ORAL
  Filled 2015-06-30 (×8): qty 1
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
## 2015-07-01 LAB — CBC
HCT: 43.2 % (ref 39.0–52.0)
HEMOGLOBIN: 14.4 g/dL (ref 13.0–17.0)
MCH: 28.3 pg (ref 26.0–34.0)
MCHC: 33.3 g/dL (ref 30.0–36.0)
MCV: 84.9 fL (ref 78.0–100.0)
PLATELETS: 216 10*3/uL (ref 150–400)
RBC: 5.09 MIL/uL (ref 4.22–5.81)
RDW: 14.3 % (ref 11.5–15.5)
WBC: 13.6 10*3/uL — ABNORMAL HIGH (ref 4.0–10.5)

## 2015-07-01 LAB — BASIC METABOLIC PANEL
Anion gap: 10 (ref 5–15)
BUN: 39 mg/dL — AB (ref 6–20)
CO2: 30 mmol/L (ref 22–32)
Calcium: 9.1 mg/dL (ref 8.9–10.3)
Chloride: 96 mmol/L — ABNORMAL LOW (ref 101–111)
Creatinine, Ser: 1.05 mg/dL (ref 0.61–1.24)
GFR calc Af Amer: >60 mL/min (ref >60)
GLUCOSE: 117 mg/dL — AB (ref 65–99)
Potassium: 3.4 mmol/L — ABNORMAL LOW (ref 3.5–5.1)
Sodium: 136 mmol/L (ref 135–145)

## 2015-07-01 MED ORDER — LEVOFLOXACIN 750 MG PO TABS
750.0000 mg | ORAL_TABLET | Freq: Every day | ORAL | Status: DC
  Administered 2015-07-01: 750 mg via ORAL
  Filled 2015-07-01 (×2): qty 1
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
## 2015-07-01 NOTE — Progress Notes (Signed)
Trauma Service Note  Subjective: Patient wants to get up and walk around.  No acute distress  Objective: Vital signs in last 24 hours: Temp:  [98.1 F (36.7 C)-98.6 F (37 C)] 98.1 F (36.7 C) (07/30 0538) Pulse Rate:  [87-94] 94 (07/30 0538) Resp:  [16-20] 18 (07/30 0538) BP: (122-177)/(72-89) 155/72 mmHg (07/30 0538) SpO2:  [93 %-100 %] 98 % (07/30 0538) FiO2 (%):  [4 %] 4 % (07/29 2045) Weight:  [85.9 kg (189 lb 6 oz)] 85.9 kg (189 lb 6 oz) (07/30 0538) Last BM Date: 06/29/15  Intake/Output from previous day: 07/29 0701 - 07/30 0700 In: 743 [P.O.:590; I.V.:3; IV Piggyback:150] Out: 1275 [Urine:1275] Intake/Output this shift: Total I/O In: 240 [P.O.:240] Out: 300 [Urine:300]  General: No acute distress  Lungs: Still a bit diminished on the right.    IS up tp 750  Abd: Benign  Extremities: No changes  Neuro: Intact  Lab Results: CBC   Recent Labs  06/28/15 2131 07/01/15 0255  WBC 21.7* 13.6*  HGB 17.0 14.4  HCT 49.8 43.2  PLT 194 216   BMET  Recent Labs  06/28/15 2131 07/01/15 0255  NA 137 136  K 4.6 3.4*  CL 97* 96*  CO2 28 30  GLUCOSE 135* 117*  BUN 30* 39*  CREATININE 1.14 1.05  CALCIUM 9.3 9.1   PT/INR No results for input(s): LABPROT, INR in the last 72 hours. ABG No results for input(s): PHART, HCO3 in the last 72 hours.  Invalid input(s): PCO2, PO2  Studies/Results: No results found.  Anti-infectives: Anti-infectives    Start     Dose/Rate Route Frequency Ordered Stop   06/28/15 2100  levofloxacin (LEVAQUIN) IVPB 750 mg     750 mg 100 mL/hr over 90 Minutes Intravenous Every 24 hours 06/28/15 2007     06/28/15 0300  levofloxacin (LEVAQUIN) IVPB 750 mg  Status:  Discontinued     750 mg 100 mL/hr over 90 Minutes Intravenous Daily at bedtime 06/28/15 0234 06/28/15 0650      Assessment/Plan: s/p  Advance diet No intervention from trauma standpoint    LOS: 2 days   Marta Lamas. Gae Bon, MD, FACS [phone] Trauma  Surgeon 07/01/2015

## 2015-07-02 DIAGNOSIS — J181 Lobar pneumonia, unspecified organism: Secondary | ICD-10-CM

## 2015-07-02 DIAGNOSIS — J189 Pneumonia, unspecified organism: Secondary | ICD-10-CM

## 2015-07-02 MED ORDER — LEVOFLOXACIN 750 MG PO TABS
750.0000 mg | ORAL_TABLET | Freq: Every day | ORAL | Status: DC
Start: 2015-07-02 — End: 2019-09-22

## 2015-07-02 MED ORDER — ISOSORB DINITRATE-HYDRALAZINE 20-37.5 MG PO TABS: 1.0000 | ORAL_TABLET | Freq: Three times a day (TID) | ORAL | Status: DC

## 2015-07-02 MED ORDER — HYDROCODONE-ACETAMINOPHEN 5-325 MG PO TABS: 1.0000 | ORAL_TABLET | Freq: Four times a day (QID) | ORAL | Status: DC | PRN

## 2015-07-02 MED ORDER — FUROSEMIDE 40 MG PO TABS: 40.0000 mg | ORAL_TABLET | Freq: Every day | ORAL | Status: DC
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
## 2015-10-04 DIAGNOSIS — F17200 Nicotine dependence, unspecified, uncomplicated: Secondary | ICD-10-CM | POA: Insufficient documentation

## 2015-10-04 DIAGNOSIS — Z720 Tobacco use: Secondary | ICD-10-CM

## 2015-10-04 DIAGNOSIS — IMO0001 Reserved for inherently not codable concepts without codable children: Secondary | ICD-10-CM

## 2015-10-04 HISTORY — DX: Reserved for inherently not codable concepts without codable children: IMO0001

## 2015-10-04 HISTORY — DX: Nicotine dependence, unspecified, uncomplicated: F17.200

## 2015-10-04 HISTORY — DX: Tobacco use: Z72.0
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
## 2017-06-02 ENCOUNTER — Ambulatory Visit: Payer: Medicaid Other | Admitting: Cardiology

## 2018-07-01 ENCOUNTER — Ambulatory Visit: Payer: Medicaid Other | Admitting: Cardiology

## 2019-04-20 DIAGNOSIS — D2372 Other benign neoplasm of skin of left lower limb, including hip: Secondary | ICD-10-CM

## 2019-04-20 HISTORY — DX: Other benign neoplasm of skin of left lower limb, including hip: D23.72

## 2019-09-14 ENCOUNTER — Telehealth: Payer: Self-pay

## 2019-09-14 NOTE — Telephone Encounter (Signed)
Pt. Arrives for office visit with Dr. Agustin Cree. Informed that no visit is scheduled for today. Office visit scheduled for 09/21/19 at 1:20. He verbalizes understanding.

## 2019-09-21 ENCOUNTER — Ambulatory Visit: Payer: Medicaid Other | Admitting: Cardiology

## 2019-09-22 ENCOUNTER — Ambulatory Visit (INDEPENDENT_AMBULATORY_CARE_PROVIDER_SITE_OTHER): Payer: Medicaid Other | Admitting: Cardiology

## 2019-09-22 ENCOUNTER — Encounter: Payer: Self-pay | Admitting: Cardiology

## 2019-09-22 ENCOUNTER — Other Ambulatory Visit: Payer: Self-pay

## 2019-09-22 VITALS — BP 128/64 | HR 85 | Ht 72.0 in | Wt 183.4 lb

## 2019-09-22 DIAGNOSIS — I25118 Atherosclerotic heart disease of native coronary artery with other forms of angina pectoris: Secondary | ICD-10-CM | POA: Diagnosis not present

## 2019-09-22 DIAGNOSIS — F17200 Nicotine dependence, unspecified, uncomplicated: Secondary | ICD-10-CM

## 2019-09-22 DIAGNOSIS — I10 Essential (primary) hypertension: Secondary | ICD-10-CM | POA: Diagnosis not present

## 2019-09-22 DIAGNOSIS — I209 Angina pectoris, unspecified: Secondary | ICD-10-CM | POA: Insufficient documentation

## 2019-09-22 HISTORY — DX: Angina pectoris, unspecified: I20.9

## 2019-09-22 MED ORDER — RANOLAZINE ER 500 MG PO TB12: 500.0000 mg | ORAL_TABLET | Freq: Two times a day (BID) | ORAL | 1 refills | Status: DC

## 2019-09-22 NOTE — Patient Instructions (Signed)
Medication Instructions:  Your physician has recommended you make the following change in your medication:   START: Ranexa 500 mg twice daily  *If you need a refill on your cardiac medications before your next appointment, please call your pharmacy*  Lab Work: None.  If you have labs (blood work) drawn today and your tests are completely normal, you will receive your results only by: Marland Kitchen MyChart Message (if you have MyChart) OR . A paper copy in the mail If you have any lab test that is abnormal or we need to change your treatment, we will call you to review the results.  Testing/Procedures: Your physician has requested that you have a lexiscan myoview. For further information please visit [email or web address]. Please follow instruction sheet, as given.    Follow-Up: At [facility], you and your health needs are our priority.  As part of our continuing mission to provide you with exceptional heart care, we have created designated Provider Care Teams.  These Care Teams include your primary Cardiologist (physician) and Advanced Practice Providers (APPs -  Physician Assistants and Nurse Practitioners) who all work together to provide you with the care you need, when you need it.  Your next appointment:   6 week fu  The format for your next appointment:   In Person  Provider:   Jenne Campus, MD  Other Instructions  Ranolazine tablets, extended release What is this medicine? RANOLAZINE (ra NOE la zeen) is a heart medicine. It is used to treat chronic chest pain (angina). This medicine must be taken regularly. It will not relieve an acute episode of chest pain. This medicine may be used for other purposes; ask your health care provider or pharmacist if you have questions. COMMON BRAND NAME(S): Ranexa What should I tell my health care provider before I take this medicine? They need to know if you have any of these conditions:  heart disease  irregular heartbeat  kidney  disease  liver disease  low levels of potassium or magnesium in the blood  an unusual or allergic reaction to ranolazine, other medicines, foods, dyes, or preservatives  pregnant or trying to get pregnant  breast-feeding How should I use this medicine? Take this medicine by mouth with a glass of water. Follow the directions on the prescription label. Do not cut, crush, or chew this medicine. Take with or without food. Do not take this medication with grapefruit juice. Take your doses at regular intervals. Do not take your medicine more often then directed. Talk to your pediatrician regarding the use of this medicine in children. Special care may be needed. Overdosage: If you think you have taken too much of this medicine contact a poison control center or emergency room at once. NOTE: This medicine is only for you. Do not share this medicine with others. What if I miss a dose? If you miss a dose, take it as soon as you can. If it is almost time for your next dose, take only that dose. Do not take double or extra doses. What may interact with this medicine? Do not take this medicine with any of the following medications:  antivirals for HIV or AIDS  cerivastatin  certain antibiotics like chloramphenicol, clarithromycin, dalfopristin; quinupristin, isoniazid, rifabutin, rifampin, rifapentine  certain medicines used for cancer like imatinib, nilotinib  certain medicines for fungal infections like fluconazole, itraconazole, ketoconazole, posaconazole, voriconazole  certain medicines for irregular heart beat like dronedarone  certain medicines for seizures like carbamazepine, fosphenytoin, oxcarbazepine, phenobarbital, phenytoin  cisapride  conivaptan  cyclosporine  grapefruit or grapefruit juice  lumacaftor; ivacaftor  nefazodone  pimozide  quinacrine  St John's wort  thioridazine This medicine may also interact with the following medications:  alfuzosin  certain  medicines for depression, anxiety, or psychotic disturbances like bupropion, citalopram, fluoxetine, fluphenazine, paroxetine, perphenazine, risperidone, sertraline, trifluoperazine  certain medicines for cholesterol like atorvastatin, lovastatin, simvastatin  certain medicines for stomach problems like octreotide, palonosetron, prochlorperazine  eplerenone  ergot alkaloids like dihydroergotamine, ergonovine, ergotamine, methylergonovine  metformin  nicardipine  other medicines that prolong the QT interval (cause an abnormal heart rhythm) like dofetilide, ziprasidone  sirolimus  tacrolimus This list may not describe all possible interactions. Give your health care provider a list of all the medicines, herbs, non-prescription drugs, or dietary supplements you use. Also tell them if you smoke, drink alcohol, or use illegal drugs. Some items may interact with your medicine. What should I watch for while using this medicine? Visit your doctor for regular check ups. Tell your doctor or healthcare professional if your symptoms do not start to get better or if they get worse. This medicine will not relieve an acute attack of angina or chest pain. This medicine can change your heart rhythm. Your health care provider may check your heart rhythm by ordering an electrocardiogram (ECG) while you are taking this medicine. You may get drowsy or dizzy. Do not drive, use machinery, or do anything that needs mental alertness until you know how this medicine affects you. Do not stand or sit up quickly, especially if you are an older patient. This reduces the risk of dizzy or fainting spells. Alcohol may interfere with the effect of this medicine. Avoid alcoholic drinks. If you are scheduled for any medical or dental procedure, tell your healthcare provider that you are taking this medicine. This medicine can interact with other medicines used during surgery. What side effects may I notice from receiving this  medicine? Side effects that you should report to your doctor or health care professional as soon as possible:  allergic reactions like skin rash, itching or hives, swelling of the face, lips, or tongue  breathing problems  changes in vision  fast, irregular or pounding heartbeat  feeling faint or lightheaded, falls  low or high blood pressure  numbness or tingling feelings  ringing in the ears  tremor or shakiness  slow heartbeat (fewer than 50 beats per minute)  swelling of the legs or feet Side effects that usually do not require medical attention (report to your doctor or health care professional if they continue or are bothersome):  constipation  drowsy  dry mouth  headache  nausea or vomiting  stomach upset This list may not describe all possible side effects. Call your doctor for medical advice about side effects. You may report side effects to FDA at 1-800-FDA-1088. Where should I keep my medicine? Keep out of the reach of children. Store at room temperature between 15 and 30 degrees C (59 and 86 degrees F). Throw away any unused medicine after the expiration date. NOTE: This sheet is a summary. It may not cover all possible information. If you have questions about this medicine, talk to your doctor, pharmacist, or health care provider.  2020 Elsevier/Gold Standard (2018-11-10 09:18:49)   Cardiac Nuclear Scan A cardiac nuclear scan is a test that measures blood flow to the heart when a person is resting and when he or she is exercising. The test looks for problems such as:  Not enough blood reaching  a portion of the heart.  The heart muscle not working normally. You may need this test if:  You have heart disease.  You have had abnormal lab results.  You have had heart surgery or a balloon procedure to open up blocked arteries (angioplasty).  You have chest pain.  You have shortness of breath. In this test, a radioactive dye (tracer) is injected  into your bloodstream. After the tracer has traveled to your heart, an imaging device is used to measure how much of the tracer is absorbed by or distributed to various areas of your heart. This procedure is usually done at a hospital and takes 2-4 hours. Tell a health care provider about:  Any allergies you have.  All medicines you are taking, including vitamins, herbs, eye drops, creams, and over-the-counter medicines.  Any problems you or family members have had with anesthetic medicines.  Any blood disorders you have.  Any surgeries you have had.  Any medical conditions you have.  Whether you are pregnant or may be pregnant. What are the risks? Generally, this is a safe procedure. However, problems may occur, including:  Serious chest pain and heart attack. This is only a risk if the stress portion of the test is done.  Rapid heartbeat.  Sensation of warmth in your chest. This usually passes quickly.  Allergic reaction to the tracer. What happens before the procedure?  Ask your health care provider about changing or stopping your regular medicines. This is especially important if you are taking diabetes medicines or blood thinners.  Follow instructions from your health care provider about eating or drinking restrictions.  Remove your jewelry on the day of the procedure. What happens during the procedure?  An IV will be inserted into one of your veins.  Your health care provider will inject a small amount of radioactive tracer through the IV.  You will wait for 20-40 minutes while the tracer travels through your bloodstream.  Your heart activity will be monitored with an electrocardiogram (ECG).  You will lie down on an exam table.  Images of your heart will be taken for about 15-20 minutes.  You may also have a stress test. For this test, one of the following may be done: ? You will exercise on a treadmill or stationary bike. While you exercise, your heart's  activity will be monitored with an ECG, and your blood pressure will be checked. ? You will be given medicines that will increase blood flow to parts of your heart. This is done if you are unable to exercise.  When blood flow to your heart has peaked, a tracer will again be injected through the IV.  After 20-40 minutes, you will get back on the exam table and have more images taken of your heart.  Depending on the type of tracer used, scans may need to be repeated 3-4 hours later.  Your IV line will be removed when the procedure is over. The procedure may vary among health care providers and hospitals. What happens after the procedure?  Unless your health care provider tells you otherwise, you may return to your normal schedule, including diet, activities, and medicines.  Unless your health care provider tells you otherwise, you may increase your fluid intake. This will help to flush the contrast dye from your body. Drink enough fluid to keep your urine pale yellow.  Ask your health care provider, or the department that is doing the test: ? When will my results be ready? ? How  will I get my results? Summary  A cardiac nuclear scan measures the blood flow to the heart when a person is resting and when he or she is exercising.  Tell your health care provider if you are pregnant.  Before the procedure, ask your health care provider about changing or stopping your regular medicines. This is especially important if you are taking diabetes medicines or blood thinners.  After the procedure, unless your health care provider tells you otherwise, increase your fluid intake. This will help flush the contrast dye from your body.  After the procedure, unless your health care provider tells you otherwise, you may return to your normal schedule, including diet, activities, and medicines. This information is not intended to replace advice given to you by your health care provider. Make sure you  discuss any questions you have with your health care provider. Document Released: 12/13/2004 Document Revised: 05/04/2018 Document Reviewed: 05/04/2018 Elsevier Patient Education  2020 [employer].

## 2019-09-22 NOTE — Progress Notes (Signed)
Cardiology Consultation:    Date:  09/22/2019   ID:  Randy Webb, DOB 1961/10/29, MRN [id number]  PCP:  Galvin Proffer, MD  Cardiologist:  Gypsy Balsam, MD   Referring MD: Galvin Proffer, MD   Chief Complaint  Patient presents with  . Follow-up  Off chest pain  History of Present Illness:    Randy Webb is a 58 y.o. male who is being seen today for the evaluation of chest pain at the request of Hague, Imran P, MD.  I took care of him many years ago he does have history of coronary artery disease in 2007 he required PTCA and stenting of one of the artery however I have no information about which artery it was.  Since that time he gets recurrent episode of chest pain.  Last time I seen him which was in 2016 we did stress test which was not perfectly normal however he did not want to do anything about it.  He comes today to me because of excessive chest pain.  He described the pain as pressure tightness squeezing in the chest that is relieved by nitroglycerin.  That sensation typically happen with exercise.  Sometimes when he gets upset he described fairly typical angina pectoris.  However, it is a stable pattern did not change frequency or duration of the symptoms.  We had a long discussion about the situation today and what to do again we talk about potentially doing cardiac catheterization he is not too keen on that.  He prefer medical therapy.  I will ask him to start taking ranolazine 500 mg twice daily.  He will be scheduled to have stress test to see how extensive the problem with facing.  He will take nitroglycerin as needed and he was also told to go to the emergency room if he gets pain that is not relieved by nitroglycerin.  No past medical history on file.  Past Surgical History:  Procedure Laterality Date  . NO PAST SURGERIES      Current Medications: Current Meds  Medication Sig  . baclofen (LIORESAL) 20 MG tablet Take 20 mg by mouth 3 (three) times daily.  .  clopidogrel (PLAVIX) 75 MG tablet Take 75 mg by mouth daily.  Marland Kitchen ezetimibe (ZETIA) 10 MG tablet Take 10 mg by mouth daily.  . furosemide (LASIX) 40 MG tablet Take 1 tablet (40 mg total) by mouth daily.  . isosorbide-hydrALAZINE (BIDIL) 20-37.5 MG per tablet Take 1 tablet by mouth 3 (three) times daily.  . meloxicam (MOBIC) 7.5 MG tablet Take 7.5 mg by mouth 3 (three) times daily.   . niacin (NIASPAN) 500 MG CR tablet Take 500 mg by mouth at bedtime.  . nitroGLYCERIN (NITROSTAT) 0.4 MG SL tablet Place 0.4 mg under the tongue as needed for chest pain.   Marland Kitchen omeprazole (PRILOSEC) 20 MG capsule Take 20 mg by mouth daily.  Marland Kitchen oxyCODONE (ROXICODONE) 15 MG immediate release tablet Take 15 mg by mouth every 8 (eight) hours.  Marland Kitchen PRESCRIPTION MEDICATION Take 30 mg by mouth 3 (three) times daily. Cevineline  . Umeclidinium-Vilanterol (ANORO ELLIPTA) 62.5-25 MCG/INH AEPB Inhale 1 puff into the lungs daily.     Allergies:   Aspirin   Social History   Socioeconomic History  . Marital status: Single    Spouse name: Not on file  . Number of children: Not on file  . Years of education: Not on file  . Highest education level: Not on file  Occupational History  .  Not on file  Social Needs  . Financial resource strain: Not on file  . Food insecurity    Worry: Not on file    Inability: Not on file  . Transportation needs    Medical: Not on file    Non-medical: Not on file  Tobacco Use  . Smoking status: Current Every Day Smoker    Packs/day: 2.00    Years: 48.00    Pack years: 96.00    Types: Cigarettes  . Smokeless tobacco: Never Used  Substance and Sexual Activity  . Alcohol use: No    Alcohol/week: 0.0 standard drinks  . Drug use: No  . Sexual activity: Not on file  Lifestyle  . Physical activity    Days per week: Not on file    Minutes per session: Not on file  . Stress: Not on file  Relationships  . Social Herbalist on phone: Not on file    Gets together: Not on file     Attends religious service: Not on file    Active member of club or organization: Not on file    Attends meetings of clubs or organizations: Not on file    Relationship status: Not on file  Other Topics Concern  . Not on file  Social History Narrative  . Not on file     Family History: The patient's \ family history includes Diabetes in his father and mother. ROS:   Please see the history of present illness.    All 14 point review of systems negative except as described per history of present illness.  EKGs/Labs/Other Studies Reviewed:    The following studies were reviewed today: Normal sinus rhythm, normal Webb interval, normal QS complex duration morphology, no ST segment changes   Recent Labs: No results found for requested labs within last 8760 hours.  Recent Lipid Panel    Component Value Date/Time   CHOL 150 06/28/2015 0244   TRIG 103 06/28/2015 0244   HDL 37 (L) 06/28/2015 0244   CHOLHDL 4.1 06/28/2015 0244   VLDL 21 06/28/2015 0244   LDLCALC 92 06/28/2015 0244    Physical Exam:    VS:  BP 128/64   Pulse 85   Ht 6' (1.829 m)   Wt 183 lb 6.4 oz (83.2 kg)   SpO2 95%   BMI 24.87 kg/m     Wt Readings from Last 3 Encounters:  09/22/19 183 lb 6.4 oz (83.2 kg)  07/02/15 187 lb 13.3 oz (85.2 kg)  03/13/09 184 lb (83.5 kg)     GEN:  Well nourished, well developed in no acute distress HEENT: Normal NECK: No JVD; No carotid bruits LYMPHATICS: No lymphadenopathy CARDIAC: RRR, no murmurs, no rubs, no gallops RESPIRATORY:  Clear to auscultation without rales, wheezing or rhonchi  ABDOMEN: Soft, non-tender, non-distended MUSCULOSKELETAL:  No edema; No deformity  SKIN: Warm and dry NEUROLOGIC:  Alert and oriented x 3 PSYCHIATRIC:  Normal affect   ASSESSMENT:    1. Atherosclerosis of native coronary artery of native heart with stable angina pectoris ([place])   2. Angina pectoris ([place])   3. HYPERTENSION, BENIGN   4. Smoking    PLAN:    In order of problems  listed above:  1. Coronary artery disease with fairly typical angina pectoris likely stable pattern.  We will do a EKG today which showed no acute changes, stress test will be scheduled to assess extensibility of the problem.  Will ask him to start taking ranolazine  5 mg twice daily.  Continue taking rest of his medication.  He also will be scheduled to have an echocardiogram to assess left ventricular ejection fraction. 2. Essential hypertension blood pressure appears to be well controlled 3. Smoking obviously problem 4. Dyslipidemia will contact his primary care physician to get his fasting lipid profile.  He is already on high intensity statin plus Zetia.   Medication Adjustments/Labs and Tests Ordered: Current medicines are reviewed at length with the patient today.  Concerns regarding medicines are outlined above.  No orders of the defined types were placed in this encounter.  No orders of the defined types were placed in this encounter.   Signed, Georgeanna Leaobert J. Jadan Hinojos, MD, [facility] CareFACC. 09/22/2019 4:18 PM    [facility]

## 2019-09-22 NOTE — Addendum Note (Signed)
Addended by: Aleatha Borer on: 09/22/2019 04:48 PM   Modules accepted: Orders

## 2019-09-22 NOTE — Addendum Note (Signed)
Addended by: Ashok Norris on: 09/22/2019 04:29 PM   Modules accepted: Orders

## 2019-09-23 ENCOUNTER — Ambulatory Visit: Payer: Medicaid Other | Admitting: Cardiology

## 2019-10-06 ENCOUNTER — Telehealth (HOSPITAL_COMMUNITY): Payer: Self-pay | Admitting: *Deleted

## 2019-10-06 NOTE — Telephone Encounter (Signed)
Attempted to call patient regarding upcoming appointment- no answer, unable to leave a message.  Phillis Thackeray Jacqueline  

## 2019-10-12 ENCOUNTER — Telehealth: Payer: Self-pay | Admitting: *Deleted

## 2019-10-12 NOTE — Telephone Encounter (Signed)
Attempted to contact patient with instructions; no answer or voice mail available.Randy Webb, Randy Webb

## 2019-11-18 ENCOUNTER — Ambulatory Visit: Payer: Medicaid Other | Admitting: Cardiology

## 2019-12-01 ENCOUNTER — Telehealth: Payer: Self-pay | Admitting: *Deleted

## 2019-12-01 NOTE — Telephone Encounter (Signed)
Left message on voicemail of cell # in reference to upcoming appointment scheduled for 12/07/2019.No answer on home # and unable to leave message Phone number given for a call back so details instructions can be given. Kellyanne Ellwanger, Ranae Palms

## 2019-12-06 ENCOUNTER — Telehealth (HOSPITAL_COMMUNITY): Payer: Self-pay | Admitting: *Deleted

## 2019-12-06 NOTE — Telephone Encounter (Signed)
Left message on voicemail in reference to upcoming appointment scheduled for 12/08/19. Phone number given for a call back so details instructions can be given.  Randy Webb

## 2020-03-24 ENCOUNTER — Other Ambulatory Visit: Payer: Self-pay | Admitting: Cardiology

## 2020-08-09 ENCOUNTER — Ambulatory Visit: Payer: Medicaid Other | Admitting: Cardiology

## 2020-10-18 ENCOUNTER — Other Ambulatory Visit: Payer: Self-pay

## 2020-10-19 ENCOUNTER — Ambulatory Visit: Payer: Medicaid Other | Admitting: Cardiology

## 2020-12-25 ENCOUNTER — Other Ambulatory Visit: Payer: Self-pay

## 2020-12-25 ENCOUNTER — Encounter: Payer: Self-pay | Admitting: Cardiology

## 2020-12-25 ENCOUNTER — Ambulatory Visit: Payer: Medicaid Other | Admitting: Cardiology

## 2020-12-25 VITALS — BP 110/72 | HR 75 | Ht 72.0 in | Wt 185.0 lb

## 2020-12-25 DIAGNOSIS — I25118 Atherosclerotic heart disease of native coronary artery with other forms of angina pectoris: Secondary | ICD-10-CM | POA: Diagnosis not present

## 2020-12-25 DIAGNOSIS — R0602 Shortness of breath: Secondary | ICD-10-CM | POA: Diagnosis not present

## 2020-12-25 DIAGNOSIS — E7800 Pure hypercholesterolemia, unspecified: Secondary | ICD-10-CM

## 2020-12-25 DIAGNOSIS — F17200 Nicotine dependence, unspecified, uncomplicated: Secondary | ICD-10-CM

## 2020-12-25 DIAGNOSIS — IMO0001 Reserved for inherently not codable concepts without codable children: Secondary | ICD-10-CM

## 2020-12-25 DIAGNOSIS — R079 Chest pain, unspecified: Secondary | ICD-10-CM | POA: Diagnosis not present

## 2020-12-25 MED ORDER — RANOLAZINE ER 500 MG PO TB12: 500.0000 mg | ORAL_TABLET | Freq: Two times a day (BID) | ORAL | 0 refills | Status: DC

## 2020-12-25 NOTE — Patient Instructions (Addendum)
Medication Instructions:   Your physician has recommended you make the following change in your medication:   START: Ranexa 500mg  twice daily  *If you need a refill on your cardiac medications before your next appointment, please call your pharmacy*   Lab Work: NONE If you have labs (blood work) drawn today and your tests are completely normal, you will receive your results only by: MyChart Message (if you have MyChart) OR . A paper copy in the mail If you have any lab test that is abnormal or we need to change your treatment, we will call you to review the results.   Testing/Procedures:   [facility] [address] [place], [place] Phone:  [phone]    Please arrive 15 minutes prior to your appointment time for registration and insurance purposes.  The test will take approximately 3 to 4 hours to complete; you may bring reading material.  If someone comes with you to your appointment, they will need to remain in the main lobby due to limited space in the testing area. **If you are pregnant or breastfeeding, please notify the nuclear lab prior to your appointment**  How to prepare for your Myocardial Perfusion Test: . Do not eat or drink 3 hours prior to your test, except you may have water. . Do not consume products containing caffeine (regular or decaffeinated) 12 hours prior to your test. (ex: coffee, chocolate, sodas, tea). . Do bring a list of your current medications with you.  If not listed below, you may take your medications as normal. . HOLD Erectile dysfunction medication:  . Do wear comfortable clothes (no dresses or overalls) and walking shoes, tennis shoes preferred (No heels or open toe shoes are allowed). . Do NOT wear cologne, perfume, aftershave, or lotions (deodorant is allowed). . If these instructions are not followed, your test will have to be rescheduled.  Please report to [address] for your test.  If  you have questions or concerns about your appointment, you can call the [facility] at [phone].  If you cannot keep your appointment, please provide 24 hours notification to the Nuclear Lab, to avoid a possible $50 charge to your account.    Follow-Up: At [facility], you and your health needs are our priority.  As part of our continuing mission to provide you with exceptional heart care, we have created designated Provider Care Teams.  These Care Teams include your primary Cardiologist (physician) and Advanced Practice Providers (APPs -  Physician Assistants and Nurse Practitioners) who all work together to provide you with the care you need, when you need it.  We recommend signing up for the patient portal called "MyChart".  Sign up information is provided on this After Visit Summary.  MyChart is used to connect with patients for Virtual Visits (Telemedicine).  Patients are able to view lab/test results, encounter notes, upcoming appointments, etc.  Non-urgent messages can be sent to your provider as well.   To learn more about what you can do with MyChart, go to [facility].    Your next appointment:   3 month(s)  The format for your next appointment:   In Person  Provider:   [email or web address], MD   Other Instructions  Nuclear Medicine Exam A nuclear medicine exam is a safe and painless imaging test. It helps your health care provider detect and diagnose diseases. It also provides information about the ways your organs work and how  they are structured. For a nuclear medicine exam, you will be given a radioactive material, called a tracer, that is absorbed by your body's organs. A large scanning machine detects the radioactive tracer and creates pictures of the areas that your health care provider wants to know more about. There are several kinds of nuclear medicine exams. They include the following:  CT scan.  MRI scan.  PET  scan.  SPECT scan. Tell your health care provider about:  Any allergies you have.  All medicines you are taking, including vitamins, herbs, eye drops, creams, and over-the-counter medicines.  Any problems you or family members have had with anesthetic medicines.  Any blood disorders you have.  Any surgeries you have had.  Any medical conditions you have.  Whether you are pregnant, may be pregnant, or are breastfeeding. What are the risks? Generally, this is a safe procedure. However, problems may occur, such as:  An allergic reaction to the tracer. This is rare.  Exposure to radiation (a small amount). What happens before the procedure? Medicines Ask your health care provider about:  Changing or stopping your regular medicines. This is especially important if you are taking diabetes medicines or blood thinners.  Taking medicines such as aspirin and ibuprofen. These medicines can thin your blood. Do not take these medicines unless your health care provider tells you to take them.  Taking over-the-counter medicines, vitamins, herbs, and supplements. General instructions  Follow instructions from your health care provider about eating and drinking restrictions.  Do not wear jewelry.  Wear loose, comfortable clothing. You may be asked to wear a hospital gown for the procedure.  Bring previous imaging studies, such as X-rays, with you to the exam if they are available. What happens during the procedure?  An IV may be inserted into one of your veins.  You will be asked to lie on a table or sit in a chair.  You will be given the radioactive tracer. You may get: ? A pill or liquid to swallow. ? An injection. ? Medicine through your IV. ? A gas to inhale.  A large scanning machine will be used to create images of your body. After the pictures are taken, you may have to wait until your health care provider can make sure that enough images were taken. The procedure may  vary among health care providers and hospitals.   What can I expect after the procedure?  It is up to you to get the results of your procedure. Ask your health care provider, or the department that is doing the procedure, when your results will be ready. Also ask: ? How will I get my results? ? What are my treatment options? ? What other tests do I need? ? What are my next steps? Follow these instructions at home:  Drink enough water to keep your urine pale yellow (6-8 glasses). This helps to remove the radioactive tracer from your body.  You may return to your normal activities as told by your health care provider. Get help right away if you:  Have problems breathing. This symptom may represent a serious problem that is an emergency. Do not wait to see if the symptoms will go away. Get medical help right away. Call your local emergency services (911 in the [place] Do not drive yourself to the hospital. Summary  A nuclear medicine exam is a safe and painless imaging test. It provides information about how your organs are working. It is also used to detect and  diagnose diseases.  During the procedure, you will be given a radioactive tracer. A large scanning machine will create images of your body.  You may resume your normal activities after the procedure. Follow your health care provider's instructions.  Get help right away if you have problems breathing. This information is not intended to replace advice given to you by your health care provider. Make sure you discuss any questions you have with your health care provider. Document Revised: 03/25/2020 Document Reviewed: 03/25/2020 Elsevier Patient Education  2021 [employer].
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
## 2021-01-18 ENCOUNTER — Telehealth (HOSPITAL_COMMUNITY): Payer: Self-pay

## 2021-01-18 NOTE — Telephone Encounter (Signed)
Attempted to contact the patient, the mailbox was full. Will try again later. S.Jerid Catherman EMTP

## 2021-01-19 ENCOUNTER — Other Ambulatory Visit: Payer: Medicaid Other

## 2021-01-23 ENCOUNTER — Telehealth (HOSPITAL_COMMUNITY): Payer: Self-pay | Admitting: *Deleted

## 2021-01-23 NOTE — Telephone Encounter (Signed)
Attempted to call patient to review upcoming test instructions- no answer, mailbox full.  Randy Webb

## 2021-01-24 ENCOUNTER — Encounter: Payer: Self-pay | Admitting: *Deleted

## 2021-01-24 ENCOUNTER — Telehealth: Payer: Self-pay | Admitting: *Deleted

## 2021-01-24 NOTE — Telephone Encounter (Signed)
Left message on voicemail in reference to upcoming appointment scheduled for 01/25/21 on cell #. Home # would not connect.Phone number given for a call back so details instructions can be given. Ethaniel Garfield, Adelene Idler

## 2021-02-05 ENCOUNTER — Other Ambulatory Visit: Payer: Self-pay | Admitting: Cardiology

## 2021-02-19 ENCOUNTER — Other Ambulatory Visit: Payer: Medicaid Other

## 2021-03-26 ENCOUNTER — Other Ambulatory Visit: Payer: Self-pay | Admitting: Cardiology

## 2021-03-26 NOTE — Telephone Encounter (Signed)
Rx refill sent to pharmacy. 

## 2021-03-28 ENCOUNTER — Other Ambulatory Visit: Payer: Self-pay

## 2021-03-28 DIAGNOSIS — M51379 Other intervertebral disc degeneration, lumbosacral region without mention of lumbar back pain or lower extremity pain: Secondary | ICD-10-CM | POA: Insufficient documentation

## 2021-03-28 DIAGNOSIS — D518 Other vitamin B12 deficiency anemias: Secondary | ICD-10-CM | POA: Insufficient documentation

## 2021-03-28 DIAGNOSIS — E559 Vitamin D deficiency, unspecified: Secondary | ICD-10-CM | POA: Insufficient documentation

## 2021-03-28 DIAGNOSIS — E1165 Type 2 diabetes mellitus with hyperglycemia: Secondary | ICD-10-CM | POA: Insufficient documentation

## 2021-03-28 DIAGNOSIS — K219 Gastro-esophageal reflux disease without esophagitis: Secondary | ICD-10-CM

## 2021-03-28 DIAGNOSIS — M5137 Other intervertebral disc degeneration, lumbosacral region: Secondary | ICD-10-CM

## 2021-03-28 DIAGNOSIS — G894 Chronic pain syndrome: Secondary | ICD-10-CM | POA: Insufficient documentation

## 2021-03-28 DIAGNOSIS — J449 Chronic obstructive pulmonary disease, unspecified: Secondary | ICD-10-CM

## 2021-03-28 DIAGNOSIS — K2100 Gastro-esophageal reflux disease with esophagitis, without bleeding: Secondary | ICD-10-CM | POA: Insufficient documentation

## 2021-03-28 DIAGNOSIS — G9009 Other idiopathic peripheral autonomic neuropathy: Secondary | ICD-10-CM

## 2021-03-28 DIAGNOSIS — I252 Old myocardial infarction: Secondary | ICD-10-CM

## 2021-03-28 DIAGNOSIS — E782 Mixed hyperlipidemia: Secondary | ICD-10-CM

## 2021-03-28 DIAGNOSIS — Z79899 Other long term (current) drug therapy: Secondary | ICD-10-CM

## 2021-03-28 DIAGNOSIS — N5201 Erectile dysfunction due to arterial insufficiency: Secondary | ICD-10-CM | POA: Insufficient documentation

## 2021-03-28 DIAGNOSIS — E039 Hypothyroidism, unspecified: Secondary | ICD-10-CM | POA: Insufficient documentation

## 2021-03-28 DIAGNOSIS — Z5181 Encounter for therapeutic drug level monitoring: Secondary | ICD-10-CM

## 2021-03-28 DIAGNOSIS — J441 Chronic obstructive pulmonary disease with (acute) exacerbation: Secondary | ICD-10-CM

## 2021-03-28 DIAGNOSIS — M150 Primary generalized (osteo)arthritis: Secondary | ICD-10-CM | POA: Insufficient documentation

## 2021-03-28 DIAGNOSIS — M62830 Muscle spasm of back: Secondary | ICD-10-CM

## 2021-03-28 DIAGNOSIS — E119 Type 2 diabetes mellitus without complications: Secondary | ICD-10-CM | POA: Insufficient documentation

## 2021-03-28 DIAGNOSIS — E291 Testicular hypofunction: Secondary | ICD-10-CM | POA: Insufficient documentation

## 2021-03-28 HISTORY — DX: Other intervertebral disc degeneration, lumbosacral region without mention of lumbar back pain or lower extremity pain: M51.379

## 2021-03-28 HISTORY — DX: Other idiopathic peripheral autonomic neuropathy: G90.09

## 2021-03-28 HISTORY — DX: Gastro-esophageal reflux disease without esophagitis: K21.9

## 2021-03-28 HISTORY — DX: Type 2 diabetes mellitus without complications: E11.9

## 2021-03-28 HISTORY — DX: Primary generalized (osteo)arthritis: M15.0

## 2021-03-28 HISTORY — DX: Other intervertebral disc degeneration, lumbosacral region: M51.37

## 2021-03-28 HISTORY — DX: Erectile dysfunction due to arterial insufficiency: N52.01

## 2021-03-28 HISTORY — DX: Muscle spasm of back: M62.830

## 2021-03-28 HISTORY — DX: Mixed hyperlipidemia: E78.2

## 2021-03-28 HISTORY — DX: Other long term (current) drug therapy: Z79.899

## 2021-03-28 HISTORY — DX: Type 2 diabetes mellitus with hyperglycemia: E11.65

## 2021-03-28 HISTORY — DX: Chronic obstructive pulmonary disease with (acute) exacerbation: J44.1

## 2021-03-28 HISTORY — DX: Old myocardial infarction: I25.2

## 2021-03-28 HISTORY — DX: Hypothyroidism, unspecified: E03.9

## 2021-03-28 HISTORY — DX: Encounter for therapeutic drug level monitoring: Z51.81

## 2021-03-28 HISTORY — DX: Gastro-esophageal reflux disease with esophagitis, without bleeding: K21.00

## 2021-03-28 HISTORY — DX: Other vitamin B12 deficiency anemias: D51.8

## 2021-03-28 HISTORY — DX: Vitamin D deficiency, unspecified: E55.9

## 2021-03-28 HISTORY — DX: Testicular hypofunction: E29.1

## 2021-03-28 HISTORY — DX: Chronic pain syndrome: G89.4

## 2021-03-28 HISTORY — DX: Chronic obstructive pulmonary disease, unspecified: J44.9

## 2021-04-05 ENCOUNTER — Ambulatory Visit: Payer: Medicaid Other | Admitting: Cardiology

## 2021-05-01 ENCOUNTER — Other Ambulatory Visit: Payer: Self-pay | Admitting: Cardiology

## 2021-05-01 NOTE — Telephone Encounter (Signed)
Refill sent to pharmacy.   

## 2021-09-20 ENCOUNTER — Other Ambulatory Visit: Payer: Self-pay | Admitting: Cardiology

## 2021-11-04 DIAGNOSIS — I350 Nonrheumatic aortic (valve) stenosis: Secondary | ICD-10-CM | POA: Diagnosis not present

## 2022-12-14 DIAGNOSIS — I350 Nonrheumatic aortic (valve) stenosis: Secondary | ICD-10-CM

## 2022-12-14 DIAGNOSIS — I493 Ventricular premature depolarization: Secondary | ICD-10-CM

## 2022-12-16 DIAGNOSIS — R079 Chest pain, unspecified: Secondary | ICD-10-CM

## 2022-12-17 DIAGNOSIS — I214 Non-ST elevation (NSTEMI) myocardial infarction: Secondary | ICD-10-CM

## 2022-12-17 DIAGNOSIS — I4891 Unspecified atrial fibrillation: Secondary | ICD-10-CM

## 2022-12-17 DIAGNOSIS — E785 Hyperlipidemia, unspecified: Secondary | ICD-10-CM

## 2022-12-17 DIAGNOSIS — J441 Chronic obstructive pulmonary disease with (acute) exacerbation: Secondary | ICD-10-CM

## 2022-12-17 DIAGNOSIS — I10 Essential (primary) hypertension: Secondary | ICD-10-CM

## 2022-12-17 DIAGNOSIS — Z720 Tobacco use: Secondary | ICD-10-CM

## 2022-12-18 ENCOUNTER — Inpatient Hospital Stay (HOSPITAL_COMMUNITY)
Admission: AD | Disposition: A | Discharge status: Home / Self Care | Payer: Self-pay | Source: Other Acute Inpatient Hospital | Attending: Cardiovascular Disease

## 2022-12-18 ENCOUNTER — Observation Stay (HOSPITAL_COMMUNITY)
Admission: AD | Admit: 2022-12-18 | Discharge: 2022-12-19 | Disposition: A | Discharge status: Home / Self Care | Payer: Medicaid Other | Source: Other Acute Inpatient Hospital | Attending: Cardiovascular Disease | Admitting: Cardiovascular Disease

## 2022-12-18 DIAGNOSIS — Z955 Presence of coronary angioplasty implant and graft: Secondary | ICD-10-CM

## 2022-12-18 DIAGNOSIS — G9009 Other idiopathic peripheral autonomic neuropathy: Secondary | ICD-10-CM | POA: Diagnosis present

## 2022-12-18 DIAGNOSIS — E039 Hypothyroidism, unspecified: Secondary | ICD-10-CM | POA: Diagnosis present

## 2022-12-18 DIAGNOSIS — Z79899 Other long term (current) drug therapy: Secondary | ICD-10-CM

## 2022-12-18 DIAGNOSIS — M150 Primary generalized (osteo)arthritis: Secondary | ICD-10-CM | POA: Diagnosis present

## 2022-12-18 DIAGNOSIS — N183 Chronic kidney disease, stage 3 unspecified: Secondary | ICD-10-CM | POA: Insufficient documentation

## 2022-12-18 DIAGNOSIS — Z79891 Long term (current) use of opiate analgesic: Secondary | ICD-10-CM | POA: Diagnosis not present

## 2022-12-18 DIAGNOSIS — G894 Chronic pain syndrome: Secondary | ICD-10-CM | POA: Diagnosis present

## 2022-12-18 DIAGNOSIS — I252 Old myocardial infarction: Secondary | ICD-10-CM

## 2022-12-18 DIAGNOSIS — J209 Acute bronchitis, unspecified: Secondary | ICD-10-CM

## 2022-12-18 DIAGNOSIS — N5201 Erectile dysfunction due to arterial insufficiency: Secondary | ICD-10-CM | POA: Diagnosis present

## 2022-12-18 DIAGNOSIS — I25110 Atherosclerotic heart disease of native coronary artery with unstable angina pectoris: Secondary | ICD-10-CM | POA: Diagnosis not present

## 2022-12-18 DIAGNOSIS — D72829 Elevated white blood cell count, unspecified: Secondary | ICD-10-CM | POA: Insufficient documentation

## 2022-12-18 DIAGNOSIS — J449 Chronic obstructive pulmonary disease, unspecified: Secondary | ICD-10-CM | POA: Diagnosis present

## 2022-12-18 DIAGNOSIS — R0902 Hypoxemia: Secondary | ICD-10-CM | POA: Diagnosis not present

## 2022-12-18 DIAGNOSIS — E1122 Type 2 diabetes mellitus with diabetic chronic kidney disease: Secondary | ICD-10-CM | POA: Diagnosis present

## 2022-12-18 DIAGNOSIS — I771 Stricture of artery: Secondary | ICD-10-CM | POA: Diagnosis present

## 2022-12-18 DIAGNOSIS — F17210 Nicotine dependence, cigarettes, uncomplicated: Secondary | ICD-10-CM | POA: Diagnosis present

## 2022-12-18 DIAGNOSIS — Z7902 Long term (current) use of antithrombotics/antiplatelets: Secondary | ICD-10-CM | POA: Diagnosis not present

## 2022-12-18 DIAGNOSIS — M5137 Other intervertebral disc degeneration, lumbosacral region: Secondary | ICD-10-CM | POA: Diagnosis present

## 2022-12-18 DIAGNOSIS — Z716 Tobacco abuse counseling: Secondary | ICD-10-CM

## 2022-12-18 DIAGNOSIS — E875 Hyperkalemia: Secondary | ICD-10-CM | POA: Diagnosis present

## 2022-12-18 DIAGNOSIS — I2510 Atherosclerotic heart disease of native coronary artery without angina pectoris: Secondary | ICD-10-CM | POA: Diagnosis present

## 2022-12-18 DIAGNOSIS — I255 Ischemic cardiomyopathy: Secondary | ICD-10-CM | POA: Diagnosis present

## 2022-12-18 DIAGNOSIS — J9601 Acute respiratory failure with hypoxia: Secondary | ICD-10-CM | POA: Diagnosis not present

## 2022-12-18 DIAGNOSIS — I200 Unstable angina: Secondary | ICD-10-CM

## 2022-12-18 DIAGNOSIS — I10 Essential (primary) hypertension: Secondary | ICD-10-CM | POA: Diagnosis not present

## 2022-12-18 DIAGNOSIS — I2582 Chronic total occlusion of coronary artery: Secondary | ICD-10-CM | POA: Diagnosis present

## 2022-12-18 DIAGNOSIS — Z833 Family history of diabetes mellitus: Secondary | ICD-10-CM

## 2022-12-18 DIAGNOSIS — I25720 Atherosclerosis of autologous artery coronary artery bypass graft(s) with unstable angina pectoris: Principal | ICD-10-CM | POA: Insufficient documentation

## 2022-12-18 DIAGNOSIS — J441 Chronic obstructive pulmonary disease with (acute) exacerbation: Secondary | ICD-10-CM | POA: Diagnosis present

## 2022-12-18 DIAGNOSIS — I214 Non-ST elevation (NSTEMI) myocardial infarction: Secondary | ICD-10-CM | POA: Diagnosis present

## 2022-12-18 DIAGNOSIS — Z886 Allergy status to analgesic agent status: Secondary | ICD-10-CM

## 2022-12-18 DIAGNOSIS — Z720 Tobacco use: Secondary | ICD-10-CM | POA: Diagnosis not present

## 2022-12-18 DIAGNOSIS — E785 Hyperlipidemia, unspecified: Secondary | ICD-10-CM | POA: Diagnosis not present

## 2022-12-18 DIAGNOSIS — E782 Mixed hyperlipidemia: Secondary | ICD-10-CM | POA: Diagnosis present

## 2022-12-18 DIAGNOSIS — I129 Hypertensive chronic kidney disease with stage 1 through stage 4 chronic kidney disease, or unspecified chronic kidney disease: Secondary | ICD-10-CM | POA: Diagnosis present

## 2022-12-18 DIAGNOSIS — T380X5A Adverse effect of glucocorticoids and synthetic analogues, initial encounter: Secondary | ICD-10-CM | POA: Diagnosis present

## 2022-12-18 HISTORY — PX: LEFT HEART CATH AND CORONARY ANGIOGRAPHY: CATH118249

## 2022-12-18 HISTORY — DX: Unstable angina: I20.0

## 2022-12-18 HISTORY — PX: CORONARY STENT INTERVENTION: CATH118234

## 2022-12-18 LAB — CBC
HCT: 41.6 % (ref 39.0–52.0)
Hemoglobin: 13.9 g/dL (ref 13.0–17.0)
MCH: 28 pg (ref 26.0–34.0)
MCHC: 33.4 g/dL (ref 30.0–36.0)
MCV: 83.7 fL (ref 80.0–100.0)
Platelets: 294 10*3/uL (ref 150–400)
RBC: 4.97 MIL/uL (ref 4.22–5.81)
RDW: 15.6 % — ABNORMAL HIGH (ref 11.5–15.5)
WBC: 22.8 10*3/uL — ABNORMAL HIGH (ref 4.0–10.5)
nRBC: 0 % (ref 0.0–0.2)

## 2022-12-18 LAB — CREATININE, SERUM
Creatinine, Ser: 1.57 mg/dL — ABNORMAL HIGH (ref 0.61–1.24)
GFR, Estimated: 50 mL/min — ABNORMAL LOW (ref >60)

## 2022-12-18 LAB — POCT ACTIVATED CLOTTING TIME
Activated Clotting Time: 239 seconds
Activated Clotting Time: 255 seconds

## 2022-12-18 SURGERY — LEFT HEART CATH AND CORONARY ANGIOGRAPHY
Anesthesia: LOCAL

## 2022-12-18 MED ORDER — NITROGLYCERIN 0.4 MG SL SUBL
0.4000 mg | SUBLINGUAL_TABLET | SUBLINGUAL | Status: DC | PRN
  Administered 2022-12-18: 0.4 mg via SUBLINGUAL

## 2022-12-18 MED ORDER — LIDOCAINE HCL (PF) 1 % IJ SOLN
INTRAMUSCULAR | Status: AC
  Filled 2022-12-18: qty 30

## 2022-12-18 MED ORDER — MIDAZOLAM HCL 2 MG/2ML IJ SOLN
INTRAMUSCULAR | Status: AC
  Filled 2022-12-18: qty 2

## 2022-12-18 MED ORDER — ALBUTEROL SULFATE (2.5 MG/3ML) 0.083% IN NEBU
2.5000 mg | INHALATION_SOLUTION | Freq: Once | RESPIRATORY_TRACT | Status: AC
  Administered 2022-12-18: 2.5 mg via RESPIRATORY_TRACT

## 2022-12-18 MED ORDER — GABAPENTIN 300 MG PO CAPS
300.0000 mg | ORAL_CAPSULE | Freq: Two times a day (BID) | ORAL | Status: DC
  Administered 2022-12-18 – 2022-12-19 (×2): 300 mg via ORAL
  Filled 2022-12-18 (×2): qty 1

## 2022-12-18 MED ORDER — HEPARIN (PORCINE) IN NACL 1000-0.9 UT/500ML-% IV SOLN
INTRAVENOUS | Status: AC
  Filled 2022-12-18: qty 1000

## 2022-12-18 MED ORDER — ONDANSETRON HCL 4 MG/2ML IJ SOLN
INTRAMUSCULAR | Status: AC
  Filled 2022-12-18: qty 2

## 2022-12-18 MED ORDER — CLONIDINE HCL 0.1 MG PO TABS
0.1000 mg | ORAL_TABLET | Freq: Two times a day (BID) | ORAL | Status: DC
  Administered 2022-12-18 – 2022-12-19 (×2): 0.1 mg via ORAL
  Filled 2022-12-18 (×2): qty 1

## 2022-12-18 MED ORDER — SODIUM CHLORIDE 0.9% FLUSH: 3.0000 mL | Freq: Two times a day (BID) | INTRAVENOUS | Status: DC

## 2022-12-18 MED ORDER — ALBUTEROL SULFATE (2.5 MG/3ML) 0.083% IN NEBU
INHALATION_SOLUTION | RESPIRATORY_TRACT | Status: AC
  Filled 2022-12-18: qty 3

## 2022-12-18 MED ORDER — NITROGLYCERIN 1 MG/10 ML FOR IR/CATH LAB
INTRA_ARTERIAL | Status: AC
  Filled 2022-12-18: qty 10

## 2022-12-18 MED ORDER — NITROGLYCERIN 1 MG/10 ML FOR IR/CATH LAB
INTRA_ARTERIAL | Status: DC | PRN
  Administered 2022-12-18 (×4): 200 ug via INTRACORONARY

## 2022-12-18 MED ORDER — SODIUM CHLORIDE 0.9 % IV SOLN: 250.0000 mL | INTRAVENOUS | Status: DC | PRN

## 2022-12-18 MED ORDER — SODIUM CHLORIDE 0.9% FLUSH
3.0000 mL | Freq: Two times a day (BID) | INTRAVENOUS | Status: DC
  Administered 2022-12-19: 3 mL via INTRAVENOUS

## 2022-12-18 MED ORDER — LABETALOL HCL 5 MG/ML IV SOLN
INTRAVENOUS | Status: AC
  Filled 2022-12-18: qty 4

## 2022-12-18 MED ORDER — PANTOPRAZOLE SODIUM 40 MG PO TBEC
40.0000 mg | DELAYED_RELEASE_TABLET | Freq: Every day | ORAL | Status: DC
  Administered 2022-12-18 – 2022-12-19 (×2): 40 mg via ORAL
  Filled 2022-12-18 (×2): qty 1

## 2022-12-18 MED ORDER — HEPARIN SODIUM (PORCINE) 1000 UNIT/ML IJ SOLN
INTRAMUSCULAR | Status: DC | PRN
  Administered 2022-12-18: 4000 [IU] via INTRAVENOUS
  Administered 2022-12-18: 2000 [IU] via INTRAVENOUS
  Administered 2022-12-18: 4000 [IU] via INTRAVENOUS

## 2022-12-18 MED ORDER — SODIUM CHLORIDE 0.9 % WEIGHT BASED INFUSION: 1.0000 mL/kg/h | INTRAVENOUS | Status: DC

## 2022-12-18 MED ORDER — ASPIRIN 81 MG PO CHEW: 81.0000 mg | CHEWABLE_TABLET | ORAL | Status: DC

## 2022-12-18 MED ORDER — ISOSORB DINITRATE-HYDRALAZINE 20-37.5 MG PO TABS
1.0000 | ORAL_TABLET | Freq: Three times a day (TID) | ORAL | Status: DC
  Administered 2022-12-19: 1 via ORAL
  Filled 2022-12-18 (×2): qty 1

## 2022-12-18 MED ORDER — METOPROLOL SUCCINATE ER 25 MG PO TB24
25.0000 mg | ORAL_TABLET | Freq: Every day | ORAL | Status: DC
  Administered 2022-12-18 – 2022-12-19 (×2): 25 mg via ORAL
  Filled 2022-12-18 (×2): qty 1

## 2022-12-18 MED ORDER — MIDAZOLAM HCL 2 MG/2ML IJ SOLN
INTRAMUSCULAR | Status: DC | PRN
  Administered 2022-12-18: 1 mg via INTRAVENOUS

## 2022-12-18 MED ORDER — EZETIMIBE 10 MG PO TABS
10.0000 mg | ORAL_TABLET | Freq: Every day | ORAL | Status: DC
  Administered 2022-12-19: 10 mg via ORAL
  Filled 2022-12-18: qty 1

## 2022-12-18 MED ORDER — CLOPIDOGREL BISULFATE 75 MG PO TABS
ORAL_TABLET | ORAL | Status: AC
  Filled 2022-12-18: qty 2

## 2022-12-18 MED ORDER — HEPARIN SODIUM (PORCINE) 1000 UNIT/ML IJ SOLN
INTRAMUSCULAR | Status: AC
  Filled 2022-12-18: qty 10

## 2022-12-18 MED ORDER — SODIUM CHLORIDE 0.9 % WEIGHT BASED INFUSION: 3.0000 mL/kg/h | INTRAVENOUS | Status: DC

## 2022-12-18 MED ORDER — FUROSEMIDE 40 MG PO TABS
40.0000 mg | ORAL_TABLET | Freq: Every day | ORAL | Status: DC
  Administered 2022-12-19: 40 mg via ORAL
  Filled 2022-12-18: qty 1

## 2022-12-18 MED ORDER — UMECLIDINIUM-VILANTEROL 62.5-25 MCG/INH IN AEPB: 1.0000 | INHALATION_SPRAY | Freq: Every day | RESPIRATORY_TRACT | Status: DC

## 2022-12-18 MED ORDER — SODIUM CHLORIDE 0.9 % WEIGHT BASED INFUSION: 1.0000 mL/kg/h | INTRAVENOUS | Status: AC

## 2022-12-18 MED ORDER — ASPIRIN 81 MG PO CHEW
81.0000 mg | CHEWABLE_TABLET | Freq: Every day | ORAL | Status: DC
  Administered 2022-12-19: 81 mg via ORAL
  Filled 2022-12-18: qty 1

## 2022-12-18 MED ORDER — VERAPAMIL HCL 2.5 MG/ML IV SOLN
INTRAVENOUS | Status: AC
  Filled 2022-12-18: qty 2

## 2022-12-18 MED ORDER — FENTANYL CITRATE (PF) 100 MCG/2ML IJ SOLN
INTRAMUSCULAR | Status: AC
  Filled 2022-12-18: qty 2

## 2022-12-18 MED ORDER — ACETAMINOPHEN 325 MG PO TABS: 650.0000 mg | ORAL_TABLET | ORAL | Status: DC | PRN

## 2022-12-18 MED ORDER — LIDOCAINE HCL (PF) 1 % IJ SOLN
INTRAMUSCULAR | Status: DC | PRN
  Administered 2022-12-18: 2 mL

## 2022-12-18 MED ORDER — CLOPIDOGREL BISULFATE 75 MG PO TABS
75.0000 mg | ORAL_TABLET | Freq: Every day | ORAL | Status: DC
  Administered 2022-12-19: 75 mg via ORAL
  Filled 2022-12-18: qty 1

## 2022-12-18 MED ORDER — ATORVASTATIN CALCIUM 80 MG PO TABS
80.0000 mg | ORAL_TABLET | Freq: Every day | ORAL | Status: DC
  Administered 2022-12-19: 80 mg via ORAL
  Filled 2022-12-18: qty 1

## 2022-12-18 MED ORDER — BACLOFEN 10 MG PO TABS
20.0000 mg | ORAL_TABLET | Freq: Three times a day (TID) | ORAL | Status: DC
  Administered 2022-12-18 – 2022-12-19 (×2): 20 mg via ORAL
  Filled 2022-12-18 (×2): qty 2

## 2022-12-18 MED ORDER — FENTANYL CITRATE (PF) 100 MCG/2ML IJ SOLN
INTRAMUSCULAR | Status: DC | PRN
  Administered 2022-12-18: 25 ug via INTRAVENOUS

## 2022-12-18 MED ORDER — CEVIMELINE HCL 30 MG PO CAPS: 30.0000 mg | ORAL_CAPSULE | Freq: Three times a day (TID) | ORAL | Status: DC

## 2022-12-18 MED ORDER — SODIUM CHLORIDE 0.9% FLUSH: 3.0000 mL | INTRAVENOUS | Status: DC | PRN

## 2022-12-18 MED ORDER — ONDANSETRON HCL 4 MG/2ML IJ SOLN: 4.0000 mg | Freq: Four times a day (QID) | INTRAMUSCULAR | Status: DC | PRN

## 2022-12-18 MED ORDER — ONDANSETRON HCL 4 MG/2ML IJ SOLN
INTRAMUSCULAR | Status: DC | PRN
  Administered 2022-12-18: 4 mg via INTRAVENOUS

## 2022-12-18 MED ORDER — LABETALOL HCL 5 MG/ML IV SOLN
10.0000 mg | Freq: Once | INTRAVENOUS | Status: AC
  Administered 2022-12-18: 10 mg via INTRAVENOUS

## 2022-12-18 MED ORDER — ONDANSETRON 4 MG PO TBDP: ORAL_TABLET | ORAL | Status: DC | PRN

## 2022-12-18 MED ORDER — RANOLAZINE ER 500 MG PO TB12
500.0000 mg | ORAL_TABLET | Freq: Two times a day (BID) | ORAL | Status: DC
  Administered 2022-12-18 – 2022-12-19 (×2): 500 mg via ORAL
  Filled 2022-12-18 (×2): qty 1

## 2022-12-18 MED ORDER — ENOXAPARIN SODIUM 40 MG/0.4ML IJ SOSY
40.0000 mg | PREFILLED_SYRINGE | INTRAMUSCULAR | Status: DC
  Filled 2022-12-18: qty 0.4

## 2022-12-18 MED ORDER — UMECLIDINIUM-VILANTEROL 62.5-25 MCG/ACT IN AEPB
1.0000 | INHALATION_SPRAY | Freq: Every day | RESPIRATORY_TRACT | Status: DC
  Administered 2022-12-19: 1 via RESPIRATORY_TRACT
  Filled 2022-12-18: qty 14

## 2022-12-18 MED ORDER — CLOPIDOGREL BISULFATE 300 MG PO TABS
ORAL_TABLET | ORAL | Status: DC | PRN
  Administered 2022-12-18: 150 mg via ORAL

## 2022-12-18 MED ORDER — LEVOFLOXACIN 750 MG PO TABS
750.0000 mg | ORAL_TABLET | Freq: Every day | ORAL | Status: DC
  Filled 2022-12-18: qty 1

## 2022-12-18 MED ORDER — HEPARIN (PORCINE) IN NACL 1000-0.9 UT/500ML-% IV SOLN
INTRAVENOUS | Status: DC | PRN
  Administered 2022-12-18 (×2): 500 mL

## 2022-12-18 MED ORDER — IOHEXOL 350 MG/ML SOLN
INTRAVENOUS | Status: DC | PRN
  Administered 2022-12-18: 190 mL

## 2022-12-18 MED ORDER — NITROGLYCERIN 0.4 MG/SPRAY TL SOLN
Status: AC
  Filled 2022-12-18: qty 4.9

## 2022-12-18 MED ORDER — VERAPAMIL HCL 2.5 MG/ML IV SOLN
INTRAVENOUS | Status: DC | PRN
  Administered 2022-12-18: 10 mL via INTRA_ARTERIAL

## 2022-12-18 MED ORDER — NIACIN ER (ANTIHYPERLIPIDEMIC) 500 MG PO TBCR
500.0000 mg | EXTENDED_RELEASE_TABLET | Freq: Every day | ORAL | Status: DC
  Administered 2022-12-18: 500 mg via ORAL
  Filled 2022-12-18 (×2): qty 1

## 2022-12-18 SURGICAL SUPPLY — 21 items
BALL SAPPHIRE NC24 3.0X26 (BALLOONS) ×1
BALLN EMERGE MR 2.0X20 (BALLOONS) ×1
BALLOON EMERGE MR 2.0X20 (BALLOONS) IMPLANT
BALLOON SAPPHIRE NC24 3.0X26 (BALLOONS) IMPLANT
CATH INFINITI 5 FR JL3.5 (CATHETERS) IMPLANT
CATH INFINITI 5FR JK (CATHETERS) IMPLANT
CATH LAUNCHER 6FR EBU3.5 (CATHETERS) IMPLANT
DEVICE RAD COMP TR BAND LRG (VASCULAR PRODUCTS) IMPLANT
GLIDESHEATH SLEND SS 6F .021 (SHEATH) IMPLANT
GUIDEWIRE INQWIRE 1.5J.035X260 (WIRE) IMPLANT
INQWIRE 1.5J .035X260CM (WIRE) ×1
KIT ENCORE 26 ADVANTAGE (KITS) IMPLANT
KIT HEART LEFT (KITS) ×1 IMPLANT
PACK CARDIAC CATHETERIZATION (CUSTOM PROCEDURE TRAY) ×1 IMPLANT
SHEATH PROBE COVER 6X72 (BAG) IMPLANT
STENT SYNERGY XD 2.75X48 (Permanent Stent) IMPLANT
SYNERGY XD 2.75X48 (Permanent Stent) ×1 IMPLANT
SYR MEDRAD MARK 7 150ML (SYRINGE) ×1 IMPLANT
TRANSDUCER W/STOPCOCK (MISCELLANEOUS) ×1 IMPLANT
TUBING CIL FLEX 10 FLL-RA (TUBING) ×1 IMPLANT
WIRE RUNTHROUGH .014X180CM (WIRE) IMPLANT

## 2022-12-18 NOTE — Progress Notes (Signed)
Pt states feels like he needs a respiratory treatment.  Takes Albuterol at home.  Order obtained from Dr Fletcher Anon.  Sat down to 85, was 89 prior to procedure.  Cough non productive.

## 2022-12-18 NOTE — Progress Notes (Signed)
Pt in cath lab holding post procedure, C/O sudden chest pain 7 out of 10  Requesting NTG.  Dr Fletcher Anon notified, EKG done.  Chest pain relieved after 1 spray of NTG.  Dr Fletcher Anon in to see pt.

## 2022-12-18 NOTE — Progress Notes (Signed)
Respiratory HHN completed, breathing feeling much better, sat up to 96.
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
## 2022-12-19 ENCOUNTER — Other Ambulatory Visit (HOSPITAL_COMMUNITY): Payer: Self-pay

## 2022-12-19 ENCOUNTER — Encounter (HOSPITAL_COMMUNITY): Payer: Self-pay | Admitting: Cardiovascular Disease

## 2022-12-19 DIAGNOSIS — I25720 Atherosclerosis of autologous artery coronary artery bypass graft(s) with unstable angina pectoris: Secondary | ICD-10-CM | POA: Diagnosis not present

## 2022-12-19 DIAGNOSIS — J441 Chronic obstructive pulmonary disease with (acute) exacerbation: Secondary | ICD-10-CM | POA: Diagnosis not present

## 2022-12-19 DIAGNOSIS — I200 Unstable angina: Secondary | ICD-10-CM | POA: Diagnosis not present

## 2022-12-19 DIAGNOSIS — J209 Acute bronchitis, unspecified: Secondary | ICD-10-CM

## 2022-12-19 DIAGNOSIS — N183 Chronic kidney disease, stage 3 unspecified: Secondary | ICD-10-CM

## 2022-12-19 DIAGNOSIS — E782 Mixed hyperlipidemia: Secondary | ICD-10-CM | POA: Diagnosis not present

## 2022-12-19 DIAGNOSIS — J449 Chronic obstructive pulmonary disease, unspecified: Secondary | ICD-10-CM

## 2022-12-19 DIAGNOSIS — I129 Hypertensive chronic kidney disease with stage 1 through stage 4 chronic kidney disease, or unspecified chronic kidney disease: Secondary | ICD-10-CM | POA: Diagnosis not present

## 2022-12-19 DIAGNOSIS — I10 Essential (primary) hypertension: Secondary | ICD-10-CM

## 2022-12-19 HISTORY — DX: Acute bronchitis, unspecified: J20.9

## 2022-12-19 HISTORY — DX: Chronic kidney disease, stage 3 unspecified: N18.30

## 2022-12-19 LAB — BASIC METABOLIC PANEL
Anion gap: 10 (ref 5–15)
BUN: 38 mg/dL — ABNORMAL HIGH (ref 8–23)
CO2: 28 mmol/L (ref 22–32)
Calcium: 8.8 mg/dL — ABNORMAL LOW (ref 8.9–10.3)
Chloride: 96 mmol/L — ABNORMAL LOW (ref 98–111)
Creatinine, Ser: 1.63 mg/dL — ABNORMAL HIGH (ref 0.61–1.24)
GFR, Estimated: 47 mL/min — ABNORMAL LOW (ref >60)
Glucose, Bld: 108 mg/dL — ABNORMAL HIGH (ref 70–99)
Potassium: 5.4 mmol/L — ABNORMAL HIGH (ref 3.5–5.1)
Sodium: 134 mmol/L — ABNORMAL LOW (ref 135–145)

## 2022-12-19 LAB — CBC
HCT: 38.2 % — ABNORMAL LOW (ref 39.0–52.0)
Hemoglobin: 12.9 g/dL — ABNORMAL LOW (ref 13.0–17.0)
MCH: 27.9 pg (ref 26.0–34.0)
MCHC: 33.8 g/dL (ref 30.0–36.0)
MCV: 82.7 fL (ref 80.0–100.0)
Platelets: 293 10*3/uL (ref 150–400)
RBC: 4.62 MIL/uL (ref 4.22–5.81)
RDW: 15.4 % (ref 11.5–15.5)
WBC: 19.3 10*3/uL — ABNORMAL HIGH (ref 4.0–10.5)
nRBC: 0.1 % (ref 0.0–0.2)

## 2022-12-19 LAB — POTASSIUM
Potassium: 5.7 mmol/L — ABNORMAL HIGH (ref 3.5–5.1)
Potassium: 4.5 mmol/L (ref 3.5–5.1)

## 2022-12-19 MED ORDER — NITROGLYCERIN 0.4 MG SL SUBL: 0.4000 mg | SUBLINGUAL_TABLET | SUBLINGUAL | 2 refills | Status: AC | PRN

## 2022-12-19 MED ORDER — FUROSEMIDE 40 MG PO TABS: 40.0000 mg | ORAL_TABLET | Freq: Every day | ORAL | 3 refills | Status: DC

## 2022-12-19 MED ORDER — METOPROLOL SUCCINATE ER 25 MG PO TB24: 25.0000 mg | ORAL_TABLET | Freq: Every day | ORAL | 2 refills | Status: DC

## 2022-12-19 MED ORDER — ALBUTEROL SULFATE (2.5 MG/3ML) 0.083% IN NEBU: 2.5000 mg | INHALATION_SOLUTION | RESPIRATORY_TRACT | 2 refills | Status: DC | PRN

## 2022-12-19 MED ORDER — CLOPIDOGREL BISULFATE 75 MG PO TABS: 75.0000 mg | ORAL_TABLET | Freq: Every day | ORAL | 3 refills | Status: DC

## 2022-12-19 MED ORDER — ALBUTEROL SULFATE (2.5 MG/3ML) 0.083% IN NEBU
2.5000 mg | INHALATION_SOLUTION | Freq: Once | RESPIRATORY_TRACT | Status: AC
  Administered 2022-12-19: 2.5 mg via RESPIRATORY_TRACT
  Filled 2022-12-19: qty 3

## 2022-12-19 MED ORDER — CLONIDINE HCL 0.1 MG PO TABS: 0.1000 mg | ORAL_TABLET | Freq: Three times a day (TID) | ORAL | 0 refills | Status: DC

## 2022-12-19 MED ORDER — SODIUM ZIRCONIUM CYCLOSILICATE 10 G PO PACK
10.0000 g | PACK | Freq: Once | ORAL | Status: AC
  Administered 2022-12-19: 10 g via ORAL
  Filled 2022-12-19: qty 1

## 2022-12-19 MED ORDER — ASPIRIN 81 MG PO CHEW: 81.0000 mg | CHEWABLE_TABLET | Freq: Every day | ORAL | 2 refills | Status: DC

## 2022-12-19 MED ORDER — PANTOPRAZOLE SODIUM 40 MG PO TBEC: 40.0000 mg | DELAYED_RELEASE_TABLET | Freq: Every day | ORAL | 2 refills | Status: AC

## 2022-12-19 MED FILL — Clopidogrel Bisulfate Tab 75 MG (Base Equiv): ORAL | Qty: 2 | Status: AC

## 2022-12-19 NOTE — Progress Notes (Addendum)
Rounding Note    Patient Name: Randy Webb Date of Encounter: 12/19/2022  [facility] Cardiologist: Gypsy Balsamobert Krasowski, MD   Subjective   No acute overnight events. Doing well today with no chest pain or shortness of breath. Potassium was 5.7 this morning. He was given a dose of Lokelma and repeat potassium 4.5. We were initially going to discharge him today. However, this was delayed due to need to recheck potassium. He now does not have a ride home. He lives in [place]. Therefore, will keep tonight and plan to discharge tomorrow.  Inpatient Medications    Scheduled Meds:  aspirin  81 mg Oral Daily   atorvastatin  80 mg Oral Daily   baclofen  20 mg Oral TID   cloNIDine  0.1 mg Oral BID   clopidogrel  75 mg Oral Daily   enoxaparin (LOVENOX) injection  40 mg Subcutaneous Q24H   ezetimibe  10 mg Oral Daily   furosemide  40 mg Oral Daily   gabapentin  300 mg Oral BID   isosorbide-hydrALAZINE  1 tablet Oral TID   metoprolol succinate  25 mg Oral Daily   niacin  500 mg Oral QHS   pantoprazole  40 mg Oral Daily   ranolazine  500 mg Oral BID   sodium chloride flush  3 mL Intravenous Q12H   umeclidinium-vilanterol  1 puff Inhalation Daily   Continuous Infusions:  sodium chloride     PRN Meds: sodium chloride, acetaminophen, nitroGLYCERIN, ondansetron (ZOFRAN) IV, sodium chloride flush   Vital Signs    Vitals:   12/19/22 1217 12/19/22 1448 12/19/22 1512 12/19/22 1609  BP: 91/61 97/65  (!) 104/8  Pulse: 77 88 72   Resp:      Temp:      TempSrc:      SpO2: 93%  (!) 89%   Weight:      Height:        Intake/Output Summary (Last 24 hours) at 12/19/2022 1703 Last data filed at 12/19/2022 1551 Gross per 24 hour  Intake 2003.73 ml  Output 2125 ml  Net -121.27 ml      12/18/2022    3:00 PM 12/25/2020    1:38 PM 09/22/2019    3:54 PM  Last 3 Weights  Weight (lbs) 174 lb 2.6 oz 185 lb 183 lb 6.4 oz  Weight (kg) 79 kg 83.915 kg 83.19 kg      Telemetry     Normal sinus rhythm.  - Personally Reviewed  ECG    Normal sinus rhythm, rate 69 bpm with T wave inversions in anterolateral leads. - Personally Reviewed  Physical Exam   GEN: No acute distress.   Neck: No JVD. Cardiac: RRR. No murmurs, rubs, or gallops. Right radial cath with ecchymosis but soft and stable. Right radial pulses 2+. Respiratory: No increased work of breathing. Decreased breath sounds throughout. GI: Soft,non-distended, and non-tender. MS: No lower extremity edema. No deformity. Skin: Warm and dry. Neuro:  No focal deficits. Psych: Normal affect. Responds appropriately.  Labs    High Sensitivity Troponin:  No results for input(s): "TROPONINIHS" in the last 720 hours.   Chemistry Recent Labs  Lab 12/18/22 1920 12/19/22 0208 12/19/22 0804 12/19/22 1354  NA  --  134*  --   --   K  --  5.4* 5.7* 4.5  CL  --  96*  --   --   CO2  --  28  --   --   GLUCOSE  --  108*  --   --   BUN  --  38*  --   --   CREATININE 1.57* 1.63*  --   --   CALCIUM  --  8.8*  --   --   GFRNONAA 50* 47*  --   --   ANIONGAP  --  10  --   --     Lipids No results for input(s): "CHOL", "TRIG", "HDL", "LABVLDL", "LDLCALC", "CHOLHDL" in the last 168 hours.  Hematology Recent Labs  Lab 12/18/22 1920 12/19/22 0208  WBC 22.8* 19.3*  RBC 4.97 4.62  HGB 13.9 12.9*  HCT 41.6 38.2*  MCV 83.7 82.7  MCH 28.0 27.9  MCHC 33.4 33.8  RDW 15.6* 15.4  PLT 294 293   Thyroid No results for input(s): "TSH", "FREET4" in the last 168 hours.  BNPNo results for input(s): "BNP", "PROBNP" in the last 168 hours.  DDimer No results for input(s): "DDIMER" in the last 168 hours.   Radiology    CARDIAC CATHETERIZATION  Result Date: 12/18/2022   Ost RCA to Prox RCA lesion is 90% stenosed.   Prox RCA to Mid RCA lesion is 100% stenosed.   Mid Cx lesion is 20% stenosed.   2nd Mrg lesion is 80% stenosed.   Prox LAD lesion is 20% stenosed.   Mid LAD lesion is 90% stenosed.   Ost LAD lesion is 30% stenosed.    Ost Cx to Prox Cx lesion is 30% stenosed.   2nd Diag lesion is 80% stenosed.   Mid LAD to Dist LAD lesion is 30% stenosed.   A drug-eluting stent was successfully placed using a SYNERGY XD [id number].   Post intervention, there is a 0% residual stenosis.   There is moderate left ventricular systolic dysfunction.   LV end diastolic pressure is mildly elevated.   The left ventricular ejection fraction is 35-45% by visual estimate. 1.  Significant two-vessel coronary artery disease with chronically occluded right coronary artery with left-to-right collaterals and significant stenosis in the mid LAD at the bifurcation of a large second diagonal.  Mid left circumflex stent is patent. 2.  Moderately reduced LV systolic function with severe inferior wall hypokinesis.  Mildly elevated left ventricular end-diastolic pressure. 3.  Successful angioplasty and drug-eluting stent placement to the mid LAD.  The second diagonal was jailed by the stent with worsening ostial stenosis but continued to have TIMI-3 flow and did not require rescue. Recommendations: Dual antiplatelet therapy for at least 12 months. Aggressive treatment of risk factors. Given ischemic cardiomyopathy, will try to wean off clonidine slowly.  I added small dose Toprol.  Consider an ARNI or ARB.    Cardiac Studies   Left Cardiac Catheterization 12/18/2022:   Ost RCA to Prox RCA lesion is 90% stenosed.   Prox RCA to Mid RCA lesion is 100% stenosed.   Mid Cx lesion is 20% stenosed.   2nd Mrg lesion is 80% stenosed.   Prox LAD lesion is 20% stenosed.   Mid LAD lesion is 90% stenosed.   Ost LAD lesion is 30% stenosed.   Ost Cx to Prox Cx lesion is 30% stenosed.   2nd Diag lesion is 80% stenosed.   Mid LAD to Dist LAD lesion is 30% stenosed.   A drug-eluting stent was successfully placed using a SYNERGY XD [id number].   Post intervention, there is a 0% residual stenosis.   There is moderate left ventricular systolic dysfunction.   LV end diastolic  pressure is mildly elevated.  The left ventricular ejection fraction is 35-45% by visual estimate.   1.  Significant two-vessel coronary artery disease with chronically occluded right coronary artery with left-to-right collaterals and significant stenosis in the mid LAD at the bifurcation of a large second diagonal.  Mid left circumflex stent is patent. 2.  Moderately reduced LV systolic function with severe inferior wall hypokinesis.  Mildly elevated left ventricular end-diastolic pressure. 3.  Successful angioplasty and drug-eluting stent placement to the mid LAD.  The second diagonal was jailed by the stent with worsening ostial stenosis but continued to have TIMI-3 flow and did not require rescue.   Recommendations: Dual antiplatelet therapy for at least 12 months. Aggressive treatment of risk factors. Given ischemic cardiomyopathy, will try to wean off clonidine slowly.  I added small dose Toprol.  Consider an ARNI or ARB.   Diagnostic Dominance: Right  Intervention         Patient Profile     62 y.o. male with a history of CAD s/p BMS to LCX in 2007, hypertension, hyperlipidemia, COPD, and tobacco abuse. He was admitted to [facility] on 12/13/2022 for COPD exacerbation and acute bronchitis after presenting with acute respiratory distress. Work-up also revealed elevated troponin. Cardiology was consulted and patient underwent a stress test which was abnormal and concerning of ischemia of LAD. Therefore, he was transferred to [facility] for cardiac catheterization.  Assessment & Plan    NSTEMI CAD Transferred from [facility] for cardiac catheterization given abnormal Myoview. Myoview  showed a large severe defect involving the mid inferior septal and apical lateral segments concerning for ischemia in the LAD. EF was read as 32%. Echo at [facility] showed LVEF of 55-60% with no regional wall motion abnormalities, and mild aortic stenosis. Given abnormal stress test, patient was transferred  to [place] on 12/18/2022 for cardiac catheterization. LHC showed chronically occluded RCA with left to right collaterals, 90% stenosis of mid LAD, 80% stenosis of 2nd Diag, and otherwise mild disease. Prior LCX stent was patient Also showed moderately reduced LV systolic function with EF of 35-45% with severe inferior wall hypokinesis. Patient underwent successful angioplasty and DES of mid LAD. The 2nd Diag was jailed by the stent with worsening ostial stenosis but continued to have TIMI-3 flow and did not require rescue.  - No recurrent chest pain following PCI.  - Continue current antianginals: Toprol-XL 25mg  daily (new this admission), Bidil 20-37.5mg  three times daily, and Ranexa 500mg  twice daily. - Continue  DAPT with Aspirin 81mg  daily and Plavix 75mg  daily for 12 months.  - Continue Lipitor 80mg  daily.   Ischemic Cardiomyopathy Echo at [facility] showed LVEF of 55-60% with no regional wall motion abnormalities. However, EF was around 32% on Myoview and 35-45% on cardiac catheterization. LV gram during cath also showed severe inferior wall hypokinesis. LVEDP was mildly elevated. - Appears euvolemic on exam.  - Continue Lasix 40mg  daily. Patient states he usually takes 80mg  once daily at home for lower extremity edema but he is tolerating current dose well so will continue 40mg  daily for now.  - No ACEi/ARB/ARNI or MRA due to soft BP and hyperkalemia. - Continue Toprol-XL 25mg  daily.   Acute Hypoxic Respiratory Failure COPD Exacerbation Acute Bronchitis Patient was initially admitted to [facility] for COPD exacerbation along with bronchitis. He was treated of steroids and antibiotics (Rocephin and Azithromycin) with significant improvement in respiratory status. Completed steroid and antibiotic treatment prior to transfer.  - Patient denies any shortness of breath. His O2 sat did periodically drop  to 88-89% today and he was intermittently placed on 2-3L of home O2. Given underlying COPD,  suspect he likely does this at home as well. - Ordered albuterol nebulizer earlier today. Continue home inhaler. - Of note, RN ambulating with patient in the hall today and O2 sats remains above 90% on room air so he does not qualify for home O2. However, patient was wondering if he needs home O2 at night. Will order overnight pulse oximeter.    Questionable Atrial Fibrillation Initial EKG at [facility] was read as atrial fibrillation but there were notable P waves. EKG and telemetry strips were reviewed by our team on arrival to [facility] and no clear evidence of atrial fibrillation. Consistent with normal sinus rhythm with PACs. - No evidence of atrial fibrillation on telemetry here.  - No need for anticoagulation at this time.   Hypertension BP has been labile today but mostly soft. Systolic BP as low as the 80s/90s. However, it sounds like BP is labile at home as well. - Patient asymptomatic.  - Continue Bidil 20-37.5mg  three times daily. - Patient on Clonidine 0.1mg  three times daily at home. Will wean this. Discussed weaning with Pharmacy. Will decreased to twice daily for 3 days and then once daily for 3 days and then stop completely. - Will stop Baclofen (listed as a PTA medication) as patient states he no longer takes this and this can cause hypotension.  Hyperlipidemia - Continue home Lipitor 80mg  daily.  - Now that patient is staying another night, will repeat fasting lipid panel in the morning.   CKD Stage III Creatinine was 1.7 on arrival to [facility]. Baseline unknown (last labs in [facility] system are from 2016 and creatinine around 1.0 to 1.1 at that time). - Stable at 1.63 today.  and stable at 1.63 on day of discharge.  - Will repeat BMET in the morning.   Hyperkalemia Potassium 5.4 on the morning of discharge. Potassium was in the 3 range at [facility] so repeat potassium was ordered to confirm and was 5.7. Potassium improved to 4.5 after a dose of Lokelma. - Will recheck BMET  tomorrow.   Tobacco Abuse Complete cessation was encouraged.   Leukocytosis WBC initially around 14 on admission to [place] and was 22.8 on arrival to [facility].  - Slowly improving to 19.3 today. Suspect increase was likely due to steroid use. Breathing has improved back to baseline. He completed antibiotics at [facility]. No other signs of infection.   Disposition: Patient was initially going to be discharged this afternoon after repeat potassium. However, then he stated he did not have a ride. Patient lives in [place]. Therefore, will keep him another night. Will place patient back on telemetry. IV had already been removed. OK to leave this (do not need to place another IV). Will plan to discharge tomorrow. Will plan to repeat walk test tomorrow to make sure patient does not qualify for home O2.  For questions or updates, please contact [place] Please consult [email or web address] for contact info under        Signed, Darreld Mclean, PA-C  12/19/2022, 5:03 PM    I have examined the patient and reviewed assessment and plan and discussed with patient.  Agree with above as stated.  Right radial site stable.  Status post LAD PCI.  I stressed the importance of taking his clopidogrel.  Medical therapy for CTO of RCA.   Hyperkalemia addressed as well.  Limiting factor for him is his significant COPD and respiratory issues.  Avoid tobacco.  Initially was thought he did not have a ride to go home.  He did later find a ride so we will plan for discharge today.    Larae Grooms

## 2022-12-19 NOTE — Progress Notes (Signed)
  At 1040 went to give scheduled dose of lokelma.  Patient's SATs were 87-88% on RA.  1 L O2 [place] applied with no change.  Increased O2 to 2 L, SATs increased to 90 %.    BP checked at this time as wall monitor reading 84/52(left arm), which was down from 117/51 with AM med pass, patient asymptomatic Recheck of BP on opposite arm (right) = 91/55  Prior to leaving room SATs 89-90%, O2 increased to [place], PA notified, stated his BP's have been labile this admission and to give neb for COPD, respiratory therapy Rolley Sims notified to administer.    Per [facility]'s order, will obtain ambulating O2 saturations.

## 2022-12-19 NOTE — Discharge Summary (Addendum)
Discharge Summary    Patient ID: Randy Webb MRN: [id number]; DOB: 29-Jul-1961  Admit date: 12/18/2022 Discharge date: 12/19/2022  PCP:  Galvin Proffer, MD   [facility] Providers Cardiologist:  Gypsy Balsam, MD   {  Discharge Diagnoses    Principal Problem:   Unstable angina [facility]) Active Problems:   Hypertension, benign   CAD, NATIVE VESSEL   Acute hypoxic respiratory failure - Resolved   COPD exacerbation - Resolved   Mixed hyperlipidemia   CKD (chronic kidney disease), stage III (HCC)   Acute bronchitis - Resolved    Diagnostic Studies/Procedures    Left Cardiac Catheterization 12/18/2022:   Ost RCA to Prox RCA lesion is 90% stenosed.   Prox RCA to Mid RCA lesion is 100% stenosed.   Mid Cx lesion is 20% stenosed.   2nd Mrg lesion is 80% stenosed.   Prox LAD lesion is 20% stenosed.   Mid LAD lesion is 90% stenosed.   Ost LAD lesion is 30% stenosed.   Ost Cx to Prox Cx lesion is 30% stenosed.   2nd Diag lesion is 80% stenosed.   Mid LAD to Dist LAD lesion is 30% stenosed.   A drug-eluting stent was successfully placed using a SYNERGY XD [id number].   Post intervention, there is a 0% residual stenosis.   There is moderate left ventricular systolic dysfunction.   LV end diastolic pressure is mildly elevated.   The left ventricular ejection fraction is 35-45% by visual estimate.   1.  Significant two-vessel coronary artery disease with chronically occluded right coronary artery with left-to-right collaterals and significant stenosis in the mid LAD at the bifurcation of a large second diagonal.  Mid left circumflex stent is patent. 2.  Moderately reduced LV systolic function with severe inferior wall hypokinesis.  Mildly elevated left ventricular end-diastolic pressure. 3.  Successful angioplasty and drug-eluting stent placement to the mid LAD.  The second diagonal was jailed by the stent with worsening ostial stenosis but continued to have TIMI-3 flow and  did not require rescue.   Recommendations: Dual antiplatelet therapy for at least 12 months. Aggressive treatment of risk factors. Given ischemic cardiomyopathy, will try to wean off clonidine slowly.  I added small dose Toprol.  Consider an ARNI or ARB.  Diagnostic Dominance: Right  Intervention      _____________   History of Present Illness     Randy Webb is a 62 y.o. male with a history of CAD s/p BMS to LCX in 2007, hypertension, hyperlipidemia, COPD, and tobacco abuse. He has a history of CAD with remote MI in 2007 s/p BMS to mid LCX. Repeat cardiac catheterization in 10/2008 showed patent LCX stent with 30% stenosis of mid to distal LAD and 30-40% stenosis of proximal RCA. He was last seen by Dr. Bing Matter in 12/2020 at which time he reported intermittent chest pain. Stress test was ordered and he was started on Ranexa. However, he never had the stress test and patient was lost to follow-up.  He presented to the [facility] ED on 12/13/2022 with shortness of breath and was found to be in severe respiratory distress. CT was negative for PE but showed fifuse centrilobular nodes compatible with small airway infection/ inflammation. Chest x-ray was negative. Initial labs showed mildly elevated Troponin I at 0.95 >> 0.87 and elevated pro BNP of 4,440. WBC 14.7, Hgb 15.1. Na 135, K 3.0, Cr 1.7. Lactic acid was elevated at 5.6 >> 2.2. Procalcitonin 0.56. Respiratory panel negative for COVID  and influenza. He was admitted to Internal Medicine with the working diagnosis of an acute COPD exacerbation/ bronchitis and was treated with IV antibiotics and steroids. Blood cultures were negative.  Cardiology was asked to see for further evaluation of elevated troponin and Myoview was recommended. Myoview showed a large severe defect involving the mid inferior septal and apical lateral segments concerning for ischemia in the LAD. EF was read as 32%. Echo showed LVEF of 55-60% with no regional wall  motion abnormalities, and mild aortic stenosis. Given abnormal stress test, transfer to [facility] for cardiac catheterization was recommended.  Of note, EKG on admission was automatically read as atrial fibrillation but does have notable P waves. No clear atrial fibrillation seen.  Hospital Course     Consultants: None   NSTEMI CAD Patient was initially admitted at [facility] with COPD exacerbation/ bronchitis after presented with shortness of breath. He treated with steroid and antibiotics with significant improvement in breathing. However, he reported some off and on chest pain.  Troponin I mildly elevated at 0.95 >> 0.87. Myoview at [facility] showed a large severe defect involving the mid inferior septal and apical lateral segments concerning for ischemia in the LAD. EF was read as 32%. Echo at [facility] showed LVEF of 55-60% with no regional wall motion abnormalities, and mild aortic stenosis. Given abnormal stress test, patient was transferred to [facility] on 12/18/2022 for cardiac catheterization. LHC showed chronically occluded RCA with left to right collaterals, 90% stenosis of mid LAD, 80% stenosis of 2nd Diag, and otherwise mild disease. Prior LCX stent was patient Also showed moderately reduced LV systolic function with EF of 35-45% with severe inferior wall hypokinesis. Patient underwent successful angioplasty and DES of mid LAD. The 2nd Diag was jailed by the stent with worsening ostial stenosis but continued to have TIMI-3 flow and did not require rescue. Patient tolerated the procedure well. He denies any recurrent chest pain. Plan is for DAPT with Aspirin 81mg  daily and Plavix 75mg  daily for 12 months. He was started on Toprol-XL 25mg  daily. Continue home Bidil and Ranexa. Will continue Lipitor 80mg  daily.  Ischemic Cardiomyopathy Echo at [facility] showed LVEF of 55-60% with no regional wall motion abnormalities. However, EF was around 32% on Myoview and 35-45% on cardiac  catheterization. LV gram during cath also showed severe inferior wall hypokinesis. LVEDP was mildly elevated. Appears euvolemic. He was not started on ARB, Entresto, or Spironolactone due to hyperkalemia. Can consider starting as an outpatient pending BP, renal function, and potassium. Started on Toprol-XL 25mg  daily.  Acute Hypoxic Respiratory Failure COPD Exacerbation Acute Bronchitis Patient was initially admitted to [facility] for COPD exacerbation along with bronchitis. He was treated of steroids and antibiotics (Rocephin and Azithromycin) with significant improvement in respiratory status. Completed steroid and antibiotic treatment prior to transfer. Breathing stable while at [facility]. Continue home inhaler.   Questionable Atrial Fibrillation Initial EKG at [facility] was read as atrial fibrillation but there were notable P waves. EKG and telemetry strips were reviewed by our team on arrival to [facility] and no clear evidence of atrial fibrillation. Consistent with normal sinus rhythm with PACs. No need for anticoagulation at this time.    Hypertension BP somewhat labile. He is on Bidil 20-37.5mg  three times daily and Clonidine 0.1mg  three times daily at home. Will continue Bidil and toprol 25 mg. Plan to wean off Clonidine. Discussed weaning with Pharmacy. Will decrease to 0.1mg  twice daily for 3 days and then once daily for 3 days and  then can stop completely.    Hyperlipidemia Lipid panel was not checked at [facility] or here this admission. Continue home Lipitor 80mg  daily. Recommend repeating lipid panel and LFTs at outpatient follow-up. He reports not taking zetia.  CKD Stage III Creatinine was 1.7 on arrival to [place] and stable at 1.63 on day of discharge. Baseline unknown (last labs in [facility] are from 2016 and creatinine around 1.0 to 1.1 at that time). Recommend repeating BMET at follow-up visit.  Hyperkalemia Potassium 5.4 on the morning of discharge. Potassium was in the 3 range at  [facility] so repeat potassium was ordered to confirm and was 5.7. Patient was given a dose of Lokelma. Repeat BMP on Monday Jan 22.  Tobacco Abuse Complete cessation was encouraged. He is committed to cutting back.  Leukocytosis WBC initially around 14 on admission to [place] and was 22.8 on arrival to [facility]. Slowly improving to 19.3 on day of discharge. Suspect increase was likely due to steroid use. Breathing has improved back to baseline. He completed antibiotics at [facility]. No other signs of infection. Recommend repeating CBC at follow-up visit.   Patient seen and examined by Dr. Irish Lack today and determined to be stable for discharge. I have messaged the triage box to help with BMP on Monday and close follow up with Dr. Agustin Cree. Medications as below.  Please review home medications at follow up.    Did the patient have an acute coronary syndrome (MI, NSTEMI, STEMI, etc) this admission?:  Yes                               AHA/ACC Clinical Performance & Quality Measures: Aspirin prescribed? - Yes ADP Receptor Inhibitor (Plavix/Clopidogrel, Brilinta/Ticagrelor or Effient/Prasugrel) prescribed (includes medically managed patients)? - Yes Beta Blocker prescribed? - Yes High Intensity Statin (Lipitor 40-80mg  or Crestor 20-40mg ) prescribed? - Yes EF assessed during THIS hospitalization? - Yes For EF <40%, was ACEI/ARB prescribed? - Yes For EF <40%, Aldosterone Antagonist (Spironolactone or Eplerenone) prescribed? - No - Reason:  labile BP Cardiac Rehab Phase II ordered (including medically managed patients)? - Yes  _____________  Discharge Vitals Blood pressure (!) 104/8, pulse 72, temperature 97.6 F (36.4 C), temperature source Oral, resp. rate 16, height 6' (1.829 m), weight 79 kg, SpO2 (!) 89 %.  Filed Weights   12/18/22 1500  Weight: 79 kg   General: 62 y.o. Caucasian male resting comfortably in no acute distress. HEENT: Normocephalic and atraumatic. Sclera clear.  Neck:  Supple. No JVD. Heart: RRR. No murmurs, gallops, or rubs. Right radial cath site with ecchymosis but soft and stable. Good radial pulse.  Lungs: No increased work of breathing. Decreased breath sounds throughout but no wheezes, rhonchi, or rales appreciated.  Abdomen: Soft, non-distended, and non-tender to palpation.  Extremities: No lower extremity edema.    Skin: Warm and dry. Neuro: Alert and oriented x3. No focal deficits. Psych: Normal affect. Responds appropriately.  Labs & Radiologic Studies    CBC Recent Labs    12/18/22 1920 12/19/22 0208  WBC 22.8* 19.3*  HGB 13.9 12.9*  HCT 41.6 38.2*  MCV 83.7 82.7  PLT 294 324   Basic Metabolic Panel Recent Labs    12/18/22 1920 12/19/22 0208 12/19/22 0208 12/19/22 0804 12/19/22 1354  NA  --  134*  --   --   --   K  --  5.4*   < > 5.7* 4.5  CL  --  96*  --   --   --   CO2  --  28  --   --   --   GLUCOSE  --  108*  --   --   --   BUN  --  38*  --   --   --   CREATININE 1.57* 1.63*  --   --   --   CALCIUM  --  8.8*  --   --   --    < > = values in this interval not displayed.   Liver Function Tests No results for input(s): "AST", "ALT", "ALKPHOS", "BILITOT", "PROT", "ALBUMIN" in the last 72 hours. No results for input(s): "LIPASE", "AMYLASE" in the last 72 hours. High Sensitivity Troponin:   No results for input(s): "TROPONINIHS" in the last 720 hours.  BNP Invalid input(s): "POCBNP" D-Dimer No results for input(s): "DDIMER" in the last 72 hours. Hemoglobin A1C No results for input(s): "HGBA1C" in the last 72 hours. Fasting Lipid Panel No results for input(s): "CHOL", "HDL", "LDLCALC", "TRIG", "CHOLHDL", "LDLDIRECT" in the last 72 hours. Thyroid Function Tests No results for input(s): "TSH", "T4TOTAL", "T3FREE", "THYROIDAB" in the last 72 hours.  Invalid input(s): "FREET3" _____________  CARDIAC CATHETERIZATION  Result Date: 12/18/2022   Ost RCA to Prox RCA lesion is 90% stenosed.   Prox RCA to Mid RCA lesion is  100% stenosed.   Mid Cx lesion is 20% stenosed.   2nd Mrg lesion is 80% stenosed.   Prox LAD lesion is 20% stenosed.   Mid LAD lesion is 90% stenosed.   Ost LAD lesion is 30% stenosed.   Ost Cx to Prox Cx lesion is 30% stenosed.   2nd Diag lesion is 80% stenosed.   Mid LAD to Dist LAD lesion is 30% stenosed.   A drug-eluting stent was successfully placed using a SYNERGY XD [id number].   Post intervention, there is a 0% residual stenosis.   There is moderate left ventricular systolic dysfunction.   LV end diastolic pressure is mildly elevated.   The left ventricular ejection fraction is 35-45% by visual estimate. 1.  Significant two-vessel coronary artery disease with chronically occluded right coronary artery with left-to-right collaterals and significant stenosis in the mid LAD at the bifurcation of a large second diagonal.  Mid left circumflex stent is patent. 2.  Moderately reduced LV systolic function with severe inferior wall hypokinesis.  Mildly elevated left ventricular end-diastolic pressure. 3.  Successful angioplasty and drug-eluting stent placement to the mid LAD.  The second diagonal was jailed by the stent with worsening ostial stenosis but continued to have TIMI-3 flow and did not require rescue. Recommendations: Dual antiplatelet therapy for at least 12 months. Aggressive treatment of risk factors. Given ischemic cardiomyopathy, will try to wean off clonidine slowly.  I added small dose Toprol.  Consider an ARNI or ARB.   Disposition   Patient is being discharged home today in good condition.  Follow-up Plans & Appointments     Follow-up Information     Georgeanna Lea, MD Follow up.   Specialty: Cardiology Why: Our office will call you to arrange follow-up visit. If you do not hear from [place] within 2 business days, pleaes call to schedule this. Contact information: [address] [place] [address] [phone]         Galvin Proffer, MD Follow up.   Specialty: Internal  Medicine Why: Please call and scheduled a follow-up visit with your PCP within 1-2 weeks. Contact information: [address] [place] [address] [phone]         [facility] Follow up on 12/23/2022.   Specialty: Cardiology Why: have labwork completed - BMP Contact information: [address] [place] [phone]               Discharge Instructions     Amb Referral to Cardiac Rehabilitation   Complete by: As directed    Diagnosis: Coronary Stents   After initial evaluation and assessments completed: Virtual Based Care may be provided alone or in conjunction with Phase 2 Cardiac Rehab based on patient barriers.: Yes   Intensive Cardiac Rehabilitation (ICR) [facility] location only OR Traditional Cardiac Rehabilitation (TCR) *If criteria for ICR are not met will enroll in TCR [facility] only): Yes   Diet - low sodium heart healthy   Complete by: As directed    Discharge instructions   Complete by: As directed    No driving for 2 days. No lifting over 5 lbs for 1 week. No sexual activity for 1 week. Keep procedure site clean & dry. If you notice increased pain, swelling, bleeding or pus, call/return!  You may shower, but no soaking baths/hot tubs/pools for 1 week.   Increase activity slowly   Complete by: As directed         Discharge Medications   Allergies as of 12/19/2022       Reactions   Aspirin Other (See Comments)   Nose bleeds        Medication List     STOP taking these medications    baclofen 20 MG tablet Commonly known as: LIORESAL   meloxicam 7.5 MG tablet Commonly known as: MOBIC   metaxalone 800 MG tablet Commonly known as: SKELAXIN   omeprazole 40 MG capsule Commonly known as: PRILOSEC Replaced by: pantoprazole 40 MG tablet   PRESCRIPTION MEDICATION       TAKE these medications    albuterol (2.5 MG/3ML) 0.083% nebulizer solution Commonly known  as: PROVENTIL Take 3 mLs (2.5 mg total) by nebulization every 4 (four) hours as needed for wheezing or shortness of breath.   aspirin 81 MG chewable tablet Chew 1 tablet (81 mg total) by mouth daily. Start taking on: December 20, 2022   atorvastatin 80 MG tablet Commonly known as: LIPITOR Take 80 mg by mouth daily.   cevimeline 30 MG capsule Commonly known as: EVOXAC Take 30 mg by mouth 3 (three) times daily.   cloNIDine 0.1 MG tablet Commonly known as: CATAPRES Take 1 tablet (0.1 mg total) by mouth 3 (three) times daily. Take 1 tablet (0.1mg ) twice daily through 12/21/2022 and then on 12/22/2022 decrease to once daily for 3 days. After this stop completely. What changed: additional instructions   clopidogrel 75 MG tablet Commonly known as: PLAVIX Take 1 tablet (75 mg total) by mouth daily.   furosemide 40 MG tablet Commonly known as: LASIX Take 1 tablet (40 mg total) by mouth daily. What changed:  medication strength Another medication with the same name was removed. Continue taking this medication, and follow the directions you see here.   gabapentin 300 MG capsule Commonly known as: NEURONTIN Take 1 capsule by mouth 3 (three) times daily.   isosorbide-hydrALAZINE 20-37.5 MG tablet Commonly known as: BIDIL Take 1 tablet by mouth 3 (three) times daily.   metoprolol succinate 25 MG 24 hr tablet Commonly known as: TOPROL-XL Take 1 tablet (25 mg total)  by mouth daily. Start taking on: December 20, 2022   niacin 500 MG ER tablet Commonly known as: VITAMIN B3 Take 500 mg by mouth at bedtime.   nitroGLYCERIN 0.4 MG SL tablet Commonly known as: NITROSTAT Place 1 tablet (0.4 mg total) under the tongue every 5 (five) minutes as needed for chest pain.   oxyCODONE 15 MG immediate release tablet Commonly known as: ROXICODONE Take 15 mg by mouth every 6 (six) hours.   pantoprazole 40 MG tablet Commonly known as: PROTONIX Take 1 tablet (40 mg total) by mouth daily. Start  taking on: December 20, 2022 Replaces: omeprazole 40 MG capsule   ranolazine 500 MG 12 hr tablet Commonly known as: RANEXA TAKE 1 TABLET BY MOUTH TWICE DAILY.   umeclidinium-vilanterol 62.5-25 MCG/INH Aepb Commonly known as: ANORO ELLIPTA Inhale 1 puff into the lungs daily.           Outstanding Labs/Studies   BMP on Monday Hold potassium for now  Duration of Discharge Encounter   Greater than 30 minutes including physician time.  Signed, Roe Rutherford Duke, PA 12/19/2022, 7:24 PM  I have examined the patient and reviewed assessment and plan and discussed with patient.  Agree with above as stated.  Right radial site stable.  Status post LAD PCI.  I stressed the importance of taking his clopidogrel.  Medical therapy for CTO of RCA.   Hyperkalemia addressed as well.  Limiting factor for him is his significant COPD and respiratory issues.  Avoid tobacco.  Initially was thought he did not have a ride to go home.  He did later find a ride so we will plan for discharge today.  Questionable diagnosis of atrial fibrillation noted as well.  No A-fib noted while in the hospital here.  For now, will hold apixaban since he is going to be taking aspirin and clopidogrel.  He is somewhat frail appearing and I think his bleeding risk would be high.  Can continue to monitor.  If A-fib does not fact occur, could stop aspirin and start Eliquis.  Given prior history of nosebleeds and his overall frailty, I would be hesitant to use triple therapy.   TIME SPENT WITH PATIENT: 40 minutes of direct patient care. More than 50% of that time was spent by me on coordination of care and counseling regarding medication compliance and management of hyperkalemia.    Lance Muss

## 2022-12-19 NOTE — Progress Notes (Signed)
CARDIAC REHAB PHASE I   PRE:  Rate/Rhythm: 82 SR  BP:  Sitting: 100/72      SaO2: 96 RA  MODE:  Ambulation: 150 ft   POST:  Rate/Rhythm: 85 SR  BP:  Sitting: 147/57      SaO2: 94 RA    Pt ambulated in hall using front wheel walker for balance. Walking with slow steady pace. Returned to bed with call bell and bedside table in reach. Post stent education including antiplatelet therapy importance, exercise guidelines, site care, risk factors, smoking cessation, restrictions, heart healthy diabetic diet and CRP2 reviewed. All questions and concerns addressed. Will refer to  for CRP2. Plan for possible discharge today per pt.    [id number]  Vanessa Barbara, RN BSN 12/19/2022 9:19 AM

## 2022-12-19 NOTE — Progress Notes (Signed)
RN received call from [facility] [place] stating that patient is having difficulty obtaining a ride home.  Callie stated we would observe him one more night, restart telemetry monitoring and ok to leave IV out.

## 2022-12-19 NOTE — TOC Benefit Eligibility Note (Signed)
Patient Teacher, English as a foreign language completed.    The patient is currently admitted and upon discharge could be taking Entresto 24-26 mg.  Requires Prior Authorization  The patient is currently admitted and upon discharge could be taking Farxiga 10 mg.  Requires Prior Authorization  The patient is currently admitted and upon discharge could be taking Jardiance 10 mg.  Requires Prior Authorization  The patient is insured through [employer], [place] Patient Advocate Specialist [place] Patient Advocate Team Direct Number: [phone]  Fax: [phone]

## 2022-12-19 NOTE — Progress Notes (Signed)
Mobility Specialist - Progress Note   12/19/22 1435  Mobility  Activity Ambulated with assistance in hallway  Level of Assistance Standby assist, set-up cues, supervision of patient - no hands on  Assistive Device Front wheel walker  Distance Ambulated (ft) 180 ft  Activity Response Tolerated well  Mobility Referral Yes  $Mobility charge 1 Mobility   Pt was received in bed and agreeable to mobility. No complaints throughout session. Pt was left EOB with all needs met.  Franki Monte  Mobility Specialist Please contact via Solicitor or Rehab office at [phone]

## 2022-12-19 NOTE — Progress Notes (Signed)
SATURATION QUALIFICATIONS: (This note is used to comply with regulatory documentation for home oxygen)  Completed by Jeanella Cara (ambulation tech)  Patient Saturations on [employer] at Rest = 89%  Patient Saturations on [employer] while Ambulating = 91%  Patient Saturations on 0 Liters of oxygen while Ambulating = 92%  Please briefly explain why patient needs home oxygen: patient does not qualify for home O2

## 2022-12-19 NOTE — Discharge Instructions (Addendum)
Medication Changes: - START Aspirin 81mg  once daily (baby Aspirin) in addition to Plavix 75mg  once daily which you are already taking. - START Metoprolol succinate (Toprol-XL) 25mg  daily. - STOP Omeprazole and START Protonix 40mg  daily instead. - WEAN OFF Clonidine - Take 0.1mg  twice daily through 12/21/2022 and the starting on 12/22/2022 decrease to once daily. After this, stop completely. - DECREASE Lasix to 40mg  once daily. Please let us know if you have worsening lower extremity edema with this change.  Post Cardiac Catheterization. NO HEAVY LIFTING OR SEXUAL ACTIVITY X 7 DAYS. NO DRIVING X 3-5 DAYS. NO SOAKING BATHS, HOT TUBS, POOLS, ETC., X 7 DAYS.  Radial Site Care: Refer to this sheet in the next few weeks. These instructions provide you with information on caring for yourself after your procedure. Your caregiver may also give you more specific instructions. Your treatment has been planned according to current medical practices, but problems sometimes occur. Call your caregiver if you have any problems or questions after your procedure. HOME CARE INSTRUCTIONS You may shower the day after the procedure. Remove the bandage (dressing) and gently wash the site with plain soap and water. Gently pat the site dry.  Do not apply powder or lotion to the site.  Do not submerge the affected site in water for 3 to 5 days.  Inspect the site at least twice daily.  Do not flex or bend the affected arm for 24 hours.  No lifting over 5 pounds (2.3 kg) for 5 days after your procedure.  Do not drive home if you are discharged the same day of the procedure. Have someone else drive you.  What to expect: Any bruising will usually fade within 1 to 2 weeks.  Blood that collects in the tissue (hematoma) may be painful to the touch. It should usually decrease in size and tenderness within 1 to 2 weeks.  SEEK IMMEDIATE MEDICAL CARE IF: You have unusual pain at the radial site.  You have redness, warmth,  swelling, or pain at the radial site.  You have drainage (other than a small amount of blood on the dressing).  You have chills.  You have a fever or persistent symptoms for more than 72 hours.  You have a fever and your symptoms suddenly get worse.  Your arm becomes pale, cool, tingly, or numb.  You have heavy bleeding from the site. Hold pressure on the site.

## 2022-12-20 ENCOUNTER — Telehealth: Payer: Self-pay

## 2022-12-20 DIAGNOSIS — I10 Essential (primary) hypertension: Secondary | ICD-10-CM

## 2022-12-20 LAB — LIPOPROTEIN A (LPA): Lipoprotein (a): 88.1 nmol/L — ABNORMAL HIGH (ref <75.0)

## 2022-12-20 NOTE — Telephone Encounter (Signed)
Message from [facility]. BMP ordered per PA message. Sent to front desk to schedule follow up appt.

## 2022-12-20 NOTE — Progress Notes (Signed)
Pt educated by PA and this RN to not take Potassium at home and to follow up Monday outpatient for lab work. This RN wrote and highlighted instructions on AVS. Pt verbalized understanding.

## 2023-01-03 ENCOUNTER — Ambulatory Visit: Payer: Medicaid Other | Admitting: Cardiology

## 2023-03-13 DIAGNOSIS — I10 Essential (primary) hypertension: Secondary | ICD-10-CM

## 2023-03-13 DIAGNOSIS — I472 Ventricular tachycardia, unspecified: Secondary | ICD-10-CM | POA: Diagnosis not present

## 2023-03-13 DIAGNOSIS — I2510 Atherosclerotic heart disease of native coronary artery without angina pectoris: Secondary | ICD-10-CM | POA: Diagnosis not present

## 2023-03-13 DIAGNOSIS — I351 Nonrheumatic aortic (valve) insufficiency: Secondary | ICD-10-CM | POA: Diagnosis not present

## 2023-03-13 DIAGNOSIS — E785 Hyperlipidemia, unspecified: Secondary | ICD-10-CM

## 2023-03-13 DIAGNOSIS — I340 Nonrheumatic mitral (valve) insufficiency: Secondary | ICD-10-CM | POA: Diagnosis not present

## 2023-03-13 DIAGNOSIS — R9431 Abnormal electrocardiogram [ECG] [EKG]: Secondary | ICD-10-CM | POA: Diagnosis not present

## 2023-03-13 DIAGNOSIS — I255 Ischemic cardiomyopathy: Secondary | ICD-10-CM | POA: Diagnosis not present

## 2023-03-14 DIAGNOSIS — R9431 Abnormal electrocardiogram [ECG] [EKG]: Secondary | ICD-10-CM | POA: Diagnosis not present

## 2023-03-14 DIAGNOSIS — I472 Ventricular tachycardia, unspecified: Secondary | ICD-10-CM | POA: Diagnosis not present

## 2023-03-14 DIAGNOSIS — I255 Ischemic cardiomyopathy: Secondary | ICD-10-CM | POA: Diagnosis not present

## 2023-03-14 DIAGNOSIS — I2510 Atherosclerotic heart disease of native coronary artery without angina pectoris: Secondary | ICD-10-CM | POA: Diagnosis not present

## 2023-03-15 LAB — CMP 10231: EGFR: 60
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
## 2023-03-18 ENCOUNTER — Ambulatory Visit: Payer: Medicaid Other | Admitting: Cardiology

## 2023-03-18 ENCOUNTER — Other Ambulatory Visit: Payer: Self-pay

## 2023-03-18 DIAGNOSIS — I2510 Atherosclerotic heart disease of native coronary artery without angina pectoris: Secondary | ICD-10-CM

## 2023-03-18 DIAGNOSIS — Z720 Tobacco use: Secondary | ICD-10-CM

## 2023-03-18 DIAGNOSIS — I10 Essential (primary) hypertension: Secondary | ICD-10-CM

## 2023-03-18 DIAGNOSIS — E782 Mixed hyperlipidemia: Secondary | ICD-10-CM

## 2023-03-20 ENCOUNTER — Ambulatory Visit: Payer: Medicaid Other | Admitting: Cardiology

## 2023-04-08 ENCOUNTER — Ambulatory Visit: Payer: Medicaid Other | Admitting: Cardiology

## 2023-04-14 NOTE — Progress Notes (Signed)
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 (physician) and Advanced Practice Providers (APPs -  Physician Assistants and Nurse Practitioners) who all work together to provide you with the care you need, when you need it.  We recommend signing up for the patient portal called MyChart.  Sign up information is provided on this After Visit Summary.  MyChart is used to connect with patients for Virtual Visits (Telemedicine).  Patients are able to view lab/test results, encounter notes, upcoming appointments, etc.  Non-urgent messages can be sent to your provider as well.   To learn more about what you can do with MyChart, go to [email or web address].    Your next appointment:   3 month(s)  Provider:   Lamar Negri, MD    Other Instructions     Signed, Delon JAYSON Hoover, NP   04/15/2023 12:33 PM     [facility] "

## 2023-04-15 ENCOUNTER — Telehealth: Payer: Self-pay | Admitting: Cardiology

## 2023-04-15 ENCOUNTER — Ambulatory Visit: Payer: Medicaid Other | Attending: Cardiology | Admitting: Cardiology

## 2023-04-15 ENCOUNTER — Encounter: Payer: Self-pay | Admitting: Cardiology

## 2023-04-15 VITALS — BP 118/78 | HR 70 | Ht 72.0 in | Wt 175.4 lb

## 2023-04-15 DIAGNOSIS — R0602 Shortness of breath: Secondary | ICD-10-CM | POA: Diagnosis not present

## 2023-04-15 DIAGNOSIS — I5020 Unspecified systolic (congestive) heart failure: Secondary | ICD-10-CM

## 2023-04-15 DIAGNOSIS — I5022 Chronic systolic (congestive) heart failure: Secondary | ICD-10-CM | POA: Diagnosis not present

## 2023-04-15 DIAGNOSIS — I2510 Atherosclerotic heart disease of native coronary artery without angina pectoris: Secondary | ICD-10-CM

## 2023-04-15 DIAGNOSIS — I350 Nonrheumatic aortic (valve) stenosis: Secondary | ICD-10-CM

## 2023-04-15 DIAGNOSIS — I10 Essential (primary) hypertension: Secondary | ICD-10-CM

## 2023-04-15 NOTE — Telephone Encounter (Signed)
Left voicemail for patient to call back. 

## 2023-04-15 NOTE — Patient Instructions (Addendum)
Medication Instructions:  Your physician recommends that you continue on your current medications as directed. Please refer to the Current Medication list given to you today.  *If you need a refill on your cardiac medications before your next appointment, please call your pharmacy*   Lab Work:Your physician recommends that you return for lab work in: Today for ProBNP, Magnesium and BMP  Testing/Procedures: NONE   Follow-Up: At [facility], you and your health needs are our priority.  As part of our continuing mission to provide you with exceptional heart care, we have created designated Provider Care Teams.  These Care Teams include your primary Cardiologist (physician) and Advanced Practice Providers (APPs -  Physician Assistants and Nurse Practitioners) who all work together to provide you with the care you need, when you need it.  We recommend signing up for the patient portal called "MyChart".  Sign up information is provided on this After Visit Summary.  MyChart is used to connect with patients for Virtual Visits (Telemedicine).  Patients are able to view lab/test results, encounter notes, upcoming appointments, etc.  Non-urgent messages can be sent to your provider as well.   To learn more about what you can do with MyChart, go to [email or web address].    Your next appointment:   3 month(s)  Provider:   Neysa Hotter, MD    Other Instructions

## 2023-04-15 NOTE — Telephone Encounter (Signed)
Patient would like a CNA.   Please advise.  [phone]

## 2023-04-16 LAB — BASIC METABOLIC PANEL WITH GFR
BUN/Creatinine Ratio: 14 (ref 10–24)
BUN: 16 mg/dL (ref 8–27)
CO2: 26 mmol/L (ref 20–29)
Calcium: 9.7 mg/dL (ref 8.6–10.2)
Chloride: 99 mmol/L (ref 96–106)
Creatinine, Ser: 1.13 mg/dL (ref 0.76–1.27)
Glucose: 108 mg/dL — ABNORMAL HIGH (ref 70–99)
Potassium: 4.2 mmol/L (ref 3.5–5.2)
Sodium: 140 mmol/L (ref 134–144)
eGFR: 73 mL/min/1.73

## 2023-04-16 LAB — PRO B NATRIURETIC PEPTIDE: NT-Pro BNP: 2128 pg/mL — ABNORMAL HIGH (ref 0–210)

## 2023-04-16 LAB — MAGNESIUM: Magnesium: 2.1 mg/dL (ref 1.6–2.3)

## 2023-06-09 DIAGNOSIS — M7989 Other specified soft tissue disorders: Secondary | ICD-10-CM | POA: Diagnosis not present

## 2023-06-09 DIAGNOSIS — I2510 Atherosclerotic heart disease of native coronary artery without angina pectoris: Secondary | ICD-10-CM | POA: Diagnosis not present

## 2023-06-11 DIAGNOSIS — I2510 Atherosclerotic heart disease of native coronary artery without angina pectoris: Secondary | ICD-10-CM | POA: Diagnosis not present

## 2023-06-11 DIAGNOSIS — M7989 Other specified soft tissue disorders: Secondary | ICD-10-CM | POA: Diagnosis not present

## 2023-06-19 ENCOUNTER — Encounter: Payer: Self-pay | Admitting: Internal Medicine

## 2023-07-02 ENCOUNTER — Encounter: Payer: Self-pay | Admitting: Specialist

## 2023-07-16 ENCOUNTER — Encounter: Payer: Self-pay | Admitting: Cardiology

## 2023-07-16 ENCOUNTER — Ambulatory Visit: Payer: Medicaid Other | Attending: Cardiology | Admitting: Cardiology

## 2023-07-16 VITALS — BP 124/72 | HR 72 | Ht 72.0 in | Wt 163.2 lb

## 2023-07-16 DIAGNOSIS — I252 Old myocardial infarction: Secondary | ICD-10-CM

## 2023-07-16 DIAGNOSIS — E119 Type 2 diabetes mellitus without complications: Secondary | ICD-10-CM | POA: Diagnosis not present

## 2023-07-16 DIAGNOSIS — I2510 Atherosclerotic heart disease of native coronary artery without angina pectoris: Secondary | ICD-10-CM

## 2023-07-16 DIAGNOSIS — I10 Essential (primary) hypertension: Secondary | ICD-10-CM

## 2023-07-16 DIAGNOSIS — E782 Mixed hyperlipidemia: Secondary | ICD-10-CM

## 2023-07-16 DIAGNOSIS — F17200 Nicotine dependence, unspecified, uncomplicated: Secondary | ICD-10-CM
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
## 2023-07-16 NOTE — Patient Instructions (Signed)
Medication Instructions:  Your physician recommends that you continue on your current medications as directed. Please refer to the Current Medication list given to you today.  *If you need a refill on your cardiac medications before your next appointment, please call your pharmacy*   Lab Work:  Lipid, AST, ALT, BMP- today  If you have labs (blood work) drawn today and your tests are completely normal, you will receive your results only by: MyChart Message (if you have MyChart) OR A paper copy in the mail If you have any lab test that is abnormal or we need to change your treatment, we will call you to review the results.   Testing/Procedures: None Ordered   Follow-Up: At [facility], you and your health needs are our priority.  As part of our continuing mission to provide you with exceptional heart care, we have created designated Provider Care Teams.  These Care Teams include your primary Cardiologist (physician) and Advanced Practice Providers (APPs -  Physician Assistants and Nurse Practitioners) who all work together to provide you with the care you need, when you need it.  We recommend signing up for the patient portal called "MyChart".  Sign up information is provided on this After Visit Summary.  MyChart is used to connect with patients for Virtual Visits (Telemedicine).  Patients are able to view lab/test results, encounter notes, upcoming appointments, etc.  Non-urgent messages can be sent to your provider as well.   To learn more about what you can do with MyChart, go to [email or web address].    Your next appointment:   5 month(s)  The format for your next appointment:   In Person  Provider:   Gypsy Balsam, MD    Other Instructions NA

## 2023-07-16 NOTE — Addendum Note (Signed)
Addended by: Baldo Ash D on: 07/16/2023 01:50 PM   Modules accepted: Orders

## 2023-07-17 LAB — ALT: ALT: 9 IU/L (ref 0–44)

## 2023-07-17 LAB — BASIC METABOLIC PANEL
BUN/Creatinine Ratio: 9 — ABNORMAL LOW (ref 10–24)
BUN: 11 mg/dL (ref 8–27)
CO2: 27 mmol/L (ref 20–29)
Calcium: 9.3 mg/dL (ref 8.6–10.2)
Chloride: 99 mmol/L (ref 96–106)
Creatinine, Ser: 1.17 mg/dL (ref 0.76–1.27)
Glucose: 88 mg/dL (ref 70–99)
Potassium: 4.1 mmol/L (ref 3.5–5.2)
Sodium: 142 mmol/L (ref 134–144)
eGFR: 70 mL/min/{1.73_m2} (ref >59)

## 2023-07-17 LAB — LIPID PANEL
Chol/HDL Ratio: 8 ratio — ABNORMAL HIGH (ref 0.0–5.0)
Cholesterol, Total: 256 mg/dL — ABNORMAL HIGH (ref 100–199)
HDL: 32 mg/dL — ABNORMAL LOW (ref >39)
LDL Chol Calc (NIH): 195 mg/dL — ABNORMAL HIGH (ref 0–99)
Triglycerides: 154 mg/dL — ABNORMAL HIGH (ref 0–149)
VLDL Cholesterol Cal: 29 mg/dL (ref 5–40)

## 2023-07-17 LAB — AST: AST: 9 IU/L (ref 0–40)

## 2023-07-25 ENCOUNTER — Telehealth: Payer: Self-pay

## 2023-07-25 NOTE — Telephone Encounter (Signed)
LVM regarding lab results 

## 2023-08-12 ENCOUNTER — Telehealth: Payer: Self-pay

## 2023-08-12 NOTE — Telephone Encounter (Signed)
LM to return my call, Mailed a letter requesting a call back

## 2023-08-12 NOTE — Telephone Encounter (Signed)
-----   Message from Gypsy Balsam sent at 07/21/2023  9:21 AM EDT ----- Cholesterol very high, please ask him make sure he takes his cholesterol medication if not he need to start taking Lipitor 80 mg daily, if he does take Lipitor already lets add Zetia 10 to medical regiment, fasting lipid profile, AST ALT 6 weeks

## 2023-08-21 ENCOUNTER — Telehealth: Payer: Self-pay | Admitting: Cardiology

## 2023-08-21 DIAGNOSIS — E782 Mixed hyperlipidemia: Secondary | ICD-10-CM

## 2023-08-21 NOTE — Telephone Encounter (Signed)
Patient returning call in regards to lab results, he received a letter in the mail to call the office.

## 2023-08-21 NOTE — Telephone Encounter (Signed)
LVM to call regarding Lab results

## 2023-08-27 NOTE — Telephone Encounter (Signed)
LVM for pt to call regarding lab results.

## 2023-09-01 MED ORDER — EZETIMIBE 10 MG PO TABS: 10.0000 mg | ORAL_TABLET | Freq: Every day | ORAL | 3 refills | Status: AC

## 2023-09-01 NOTE — Telephone Encounter (Signed)
Spoke with pt regarding lab results. Pt stated that he is taking Lipitor 80mg  daily. Per Dr. Vanetta Shawl note will add Zetia 10mg  q d and he will come for labs in 6 weeks. Pt verbalized understanding and had no further questions.

## 2023-09-01 NOTE — Addendum Note (Signed)
Addended by: Baldo Ash D on: 09/01/2023 01:53 PM   Modules accepted: Orders

## 2023-09-04 ENCOUNTER — Ambulatory Visit: Payer: Medicaid Other | Admitting: Cardiology

## 2023-09-22 ENCOUNTER — Other Ambulatory Visit: Payer: Self-pay

## 2023-09-22 ENCOUNTER — Inpatient Hospital Stay (HOSPITAL_COMMUNITY)
Admission: EM | Admit: 2023-09-22 | Discharge: 2023-09-25 | DRG: 281 | Disposition: A | Discharge status: Home / Self Care | Payer: Medicaid Other | Attending: Family Medicine | Admitting: Family Medicine

## 2023-09-22 ENCOUNTER — Encounter (HOSPITAL_COMMUNITY): Payer: Self-pay

## 2023-09-22 ENCOUNTER — Emergency Department (HOSPITAL_COMMUNITY): Payer: Medicaid Other

## 2023-09-22 DIAGNOSIS — I25110 Atherosclerotic heart disease of native coronary artery with unstable angina pectoris: Secondary | ICD-10-CM | POA: Diagnosis present

## 2023-09-22 DIAGNOSIS — I214 Non-ST elevation (NSTEMI) myocardial infarction: Secondary | ICD-10-CM | POA: Diagnosis not present

## 2023-09-22 DIAGNOSIS — Z955 Presence of coronary angioplasty implant and graft: Secondary | ICD-10-CM

## 2023-09-22 DIAGNOSIS — Z720 Tobacco use: Secondary | ICD-10-CM | POA: Diagnosis present

## 2023-09-22 DIAGNOSIS — N179 Acute kidney failure, unspecified: Secondary | ICD-10-CM | POA: Diagnosis not present

## 2023-09-22 DIAGNOSIS — R079 Chest pain, unspecified: Principal | ICD-10-CM | POA: Diagnosis present

## 2023-09-22 DIAGNOSIS — Z7902 Long term (current) use of antithrombotics/antiplatelets: Secondary | ICD-10-CM

## 2023-09-22 DIAGNOSIS — M150 Primary generalized (osteo)arthritis: Secondary | ICD-10-CM | POA: Diagnosis present

## 2023-09-22 DIAGNOSIS — Z7982 Long term (current) use of aspirin: Secondary | ICD-10-CM

## 2023-09-22 DIAGNOSIS — E538 Deficiency of other specified B group vitamins: Secondary | ICD-10-CM | POA: Diagnosis present

## 2023-09-22 DIAGNOSIS — R7989 Other specified abnormal findings of blood chemistry: Secondary | ICD-10-CM

## 2023-09-22 DIAGNOSIS — Z23 Encounter for immunization: Secondary | ICD-10-CM

## 2023-09-22 DIAGNOSIS — J449 Chronic obstructive pulmonary disease, unspecified: Secondary | ICD-10-CM | POA: Diagnosis present

## 2023-09-22 DIAGNOSIS — Z886 Allergy status to analgesic agent status: Secondary | ICD-10-CM

## 2023-09-22 DIAGNOSIS — I350 Nonrheumatic aortic (valve) stenosis: Secondary | ICD-10-CM | POA: Diagnosis present

## 2023-09-22 DIAGNOSIS — N182 Chronic kidney disease, stage 2 (mild): Secondary | ICD-10-CM | POA: Diagnosis present

## 2023-09-22 DIAGNOSIS — Z79899 Other long term (current) drug therapy: Secondary | ICD-10-CM

## 2023-09-22 DIAGNOSIS — E782 Mixed hyperlipidemia: Secondary | ICD-10-CM | POA: Diagnosis present

## 2023-09-22 DIAGNOSIS — R0989 Other specified symptoms and signs involving the circulatory and respiratory systems: Secondary | ICD-10-CM

## 2023-09-22 DIAGNOSIS — Z833 Family history of diabetes mellitus: Secondary | ICD-10-CM

## 2023-09-22 DIAGNOSIS — Z91148 Patient's other noncompliance with medication regimen for other reason: Secondary | ICD-10-CM

## 2023-09-22 DIAGNOSIS — M51379 Other intervertebral disc degeneration, lumbosacral region without mention of lumbar back pain or lower extremity pain: Secondary | ICD-10-CM | POA: Diagnosis present

## 2023-09-22 DIAGNOSIS — F17210 Nicotine dependence, cigarettes, uncomplicated: Secondary | ICD-10-CM | POA: Diagnosis present

## 2023-09-22 DIAGNOSIS — I2510 Atherosclerotic heart disease of native coronary artery without angina pectoris: Secondary | ICD-10-CM | POA: Diagnosis present

## 2023-09-22 DIAGNOSIS — I130 Hypertensive heart and chronic kidney disease with heart failure and stage 1 through stage 4 chronic kidney disease, or unspecified chronic kidney disease: Secondary | ICD-10-CM | POA: Diagnosis present

## 2023-09-22 DIAGNOSIS — G894 Chronic pain syndrome: Secondary | ICD-10-CM | POA: Diagnosis present

## 2023-09-22 DIAGNOSIS — I252 Old myocardial infarction: Secondary | ICD-10-CM

## 2023-09-22 DIAGNOSIS — I6521 Occlusion and stenosis of right carotid artery: Secondary | ICD-10-CM

## 2023-09-22 DIAGNOSIS — I255 Ischemic cardiomyopathy: Secondary | ICD-10-CM | POA: Diagnosis present

## 2023-09-22 DIAGNOSIS — E039 Hypothyroidism, unspecified: Secondary | ICD-10-CM | POA: Diagnosis present

## 2023-09-22 DIAGNOSIS — I5022 Chronic systolic (congestive) heart failure: Secondary | ICD-10-CM | POA: Diagnosis present

## 2023-09-22 DIAGNOSIS — E1122 Type 2 diabetes mellitus with diabetic chronic kidney disease: Secondary | ICD-10-CM | POA: Diagnosis present

## 2023-09-22 DIAGNOSIS — K219 Gastro-esophageal reflux disease without esophagitis: Secondary | ICD-10-CM | POA: Diagnosis present

## 2023-09-22 LAB — BASIC METABOLIC PANEL
Anion gap: 10 (ref 5–15)
BUN: 8 mg/dL (ref 8–23)
CO2: 27 mmol/L (ref 22–32)
Calcium: 8.9 mg/dL (ref 8.9–10.3)
Chloride: 99 mmol/L (ref 98–111)
Creatinine, Ser: 1.16 mg/dL (ref 0.61–1.24)
GFR, Estimated: >60 mL/min (ref >60)
Glucose, Bld: 98 mg/dL (ref 70–99)
Potassium: 3.9 mmol/L (ref 3.5–5.1)
Sodium: 136 mmol/L (ref 135–145)

## 2023-09-22 LAB — CBC
HCT: 41.1 % (ref 39.0–52.0)
Hemoglobin: 13.6 g/dL (ref 13.0–17.0)
MCH: 28.2 pg (ref 26.0–34.0)
MCHC: 33.1 g/dL (ref 30.0–36.0)
MCV: 85.1 fL (ref 80.0–100.0)
Platelets: 184 10*3/uL (ref 150–400)
RBC: 4.83 MIL/uL (ref 4.22–5.81)
RDW: 15.6 % — ABNORMAL HIGH (ref 11.5–15.5)
WBC: 8.4 10*3/uL (ref 4.0–10.5)
nRBC: 0 % (ref 0.0–0.2)

## 2023-09-22 LAB — TROPONIN I (HIGH SENSITIVITY): Troponin I (High Sensitivity): 56 ng/L — ABNORMAL HIGH (ref <18)

## 2023-09-22 NOTE — ED Notes (Signed)
Patient transported to X-ray 
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
## 2023-09-22 NOTE — ED Provider Notes (Signed)
[place] EMERGENCY DEPARTMENT AT [facility] Provider Note   CSN: [id number] Arrival date & time: 09/22/23  2144     History {Add pertinent medical, surgical, social history, OB history to HPI:1} Chief Complaint  Patient presents with   Chest Pain    Randy Webb. is a 62 y.o. male.  The history is provided by the patient and medical records.  Chest Pain  62 year old male with history of CAD status post stenting, CKD, COPD, GERD, hyperlipidemia, B12 deficiency, presenting to the ED with chest pain.  Reports this began 2 days ago and has been intermittent.  Pain is worse with exertion.  Today had severe pain just walking out of the house to the mailbox and back.  When he returned to the house he was incredibly nauseated and lightheaded.  He did not vomit.  Symptoms are very similar to prior presentation to the hospital earlier this year resulting in new stent.  States he called 911 and took 4 baby aspirin which resolved his pain.  Reports he has been doing fairly well up until 2 days ago.  He did run out of his Plavix earlier this month but unsure of exact date.  He has been taking all of his other medications as directed.  He uses home oxygen PRN.  Home Medications Prior to Admission medications   Medication Sig Start Date End Date Taking? Authorizing Provider  albuterol (PROVENTIL) (2.5 MG/3ML) 0.083% nebulizer solution Take 3 mLs (2.5 mg total) by nebulization every 4 (four) hours as needed for wheezing or shortness of breath. 12/19/22 12/19/23  Marcelino Duster, PA  aspirin 81 MG chewable tablet Chew 1 tablet (81 mg total) by mouth daily. 12/20/22   [facility], Roe Rutherford, PA  atorvastatin (LIPITOR) 80 MG tablet Take 80 mg by mouth daily. 06/21/15   [provider]  cevimeline (EVOXAC) 30 MG capsule Take 30 mg by mouth 3 (three) times daily. 03/19/21   [provider]  Cholecalciferol (D3-1000) 25 MCG (1000 UT) capsule Take 1,000 Units by mouth daily.     [provider]  cloNIDine (CATAPRES) 0.1 MG tablet Take 1 tablet (0.1 mg total) by mouth 3 (three) times daily. Take 1 tablet (0.1mg ) twice daily through 12/21/2022 and then on 12/22/2022 decrease to once daily for 3 days. After this stop completely. 12/19/22   [facility], Roe Rutherford, PA  clopidogrel (PLAVIX) 75 MG tablet Take 1 tablet (75 mg total) by mouth daily. 12/19/22   [facility], Roe Rutherford, PA  dapagliflozin propanediol (FARXIGA) 10 MG TABS tablet Take 10 mg by mouth daily.    [provider]  ezetimibe (ZETIA) 10 MG tablet Take 1 tablet (10 mg total) by mouth daily. 09/01/23   Georgeanna Lea, MD  furosemide (LASIX) 40 MG tablet Take 1 tablet (40 mg total) by mouth daily. 12/19/22   [facility], Roe Rutherford, PA  gabapentin (NEURONTIN) 300 MG capsule Take 1 capsule by mouth 3 (three) times daily. 03/19/21   [provider]  isosorbide-hydrALAZINE (BIDIL) 20-37.5 MG per tablet Take 1 tablet by mouth 3 (three) times daily. 07/02/15   Vassie Loll, MD  magnesium oxide (MAG-OX) 400 (240 Mg) MG tablet Take 400 mg by mouth daily.    [provider]  metoprolol succinate (TOPROL-XL) 50 MG 24 hr tablet Take 50 mg by mouth daily. Take with or immediately following a meal.    [provider]  niacin (NIASPAN) 500 MG CR tablet Take 500 mg by mouth at bedtime.  [provider]  nicotine (NICODERM CQ - DOSED IN MG/24 HOURS) 21 mg/24hr patch Place 21 mg onto the skin daily. 03/17/23   [provider]  nitroGLYCERIN (NITROSTAT) 0.4 MG SL tablet Place 1 tablet (0.4 mg total) under the tongue every 5 (five) minutes as needed for chest pain. 12/19/22   D[facility], Roe Rutherford, PA  omeprazole (PRILOSEC) 40 MG capsule Take 40 mg by mouth daily.    [provider]  oxyCODONE (ROXICODONE) 15 MG immediate release tablet Take 15 mg by mouth every 6 (six) hours. 08/16/19   [provider]  pantoprazole (PROTONIX) 40 MG tablet Take 1 tablet (40 mg  total) by mouth daily. 12/20/22   [facility], Roe Rutherford, PA  potassium chloride (MICRO-K) 10 MEQ CR capsule Take 20 mEq by mouth daily. 03/03/23   [provider]  ranolazine (RANEXA) 500 MG 12 hr tablet TAKE 1 TABLET BY MOUTH TWICE DAILY. Patient taking differently: Take 500 mg by mouth 2 (two) times daily. 09/20/21   Georgeanna Lea, MD  sacubitril-valsartan (ENTRESTO) 24-26 MG Take 1 tablet by mouth 2 (two) times daily.    [provider]  spironolactone (ALDACTONE) 25 MG tablet Take 25 mg by mouth daily.    [provider]  umeclidinium-vilanterol (ANORO ELLIPTA) 62.5-25 MCG/INH AEPB Inhale 1 puff into the lungs daily.    [provider]      Allergies    Aspirin    Review of Systems   Review of Systems  Cardiovascular:  Positive for chest pain.  All other systems reviewed and are negative.   Physical Exam Updated Vital Signs BP 131/62 (BP Location: Left Arm)   Pulse 73   Temp 98.1 F (36.7 C)   Resp 18   Ht 6' (1.829 m)   Wt 75.3 kg   SpO2 100%   BMI 22.51 kg/m   Physical Exam Vitals and nursing note reviewed.  Constitutional:      Appearance: He is well-developed.     Comments: Appears older than stated aged, chronically ill appearing  HENT:     Head: Normocephalic and atraumatic.  Eyes:     Conjunctiva/sclera: Conjunctivae normal.     Pupils: Pupils are equal, round, and reactive to light.  Cardiovascular:     Rate and Rhythm: Normal rate and regular rhythm.     Heart sounds: Normal heart sounds.  Pulmonary:     Effort: Pulmonary effort is normal.     Breath sounds: Normal breath sounds.  Abdominal:     General: Bowel sounds are normal.     Palpations: Abdomen is soft.  Musculoskeletal:        General: Normal range of motion.     Cervical back: Normal range of motion.  Skin:    General: Skin is warm and dry.  Neurological:     Mental Status: He is alert and oriented to person, place, and time.     ED Results /  Procedures / Treatments   Labs (all labs ordered are listed, but only abnormal results are displayed) Labs Reviewed  CBC - Abnormal; Notable for the following components:      Result Value   RDW 15.6 (*)    All other components within normal limits  TROPONIN I (HIGH SENSITIVITY) - Abnormal; Notable for the following components:   Troponin I (High Sensitivity) 56 (*)    All other components within normal limits  BASIC METABOLIC PANEL  TROPONIN I (HIGH SENSITIVITY)    EKG EKG  Interpretation Date/Time:  Monday September 22 2023 21:48:57 EDT Ventricular Rate:  74 PR Interval:  140 QRS Duration:  108 QT Interval:  408 QTC Calculation: 452 R Axis:   85  Text Interpretation: Normal sinus rhythm Incomplete left bundle branch block Borderline ECG When compared with ECG of 16-Jul-2023 13:08, No significant change since last tracing Confirmed by Benjiman Core [id number]) on 09/22/2023 9:52:44 PM  Radiology No results found.  Procedures Procedures  {Document cardiac monitor, telemetry assessment procedure when appropriate:1}  Medications Ordered in ED Medications - No data to display  ED Course/ Medical Decision Making/ A&P   {   Click here for ABCD2, HEART and other calculatorsREFRESH Note before signing :1}                              Medical Decision Making Amount and/or Complexity of Data Reviewed Labs: ordered. Radiology: ordered.   62 year old male presenting to the ED with exertional chest pain over the past 2 days.  He has no pain by time of my evaluation.  He did take full dose aspirin prior to transportation to the hospital at instruction of EMS.  He is afebrile and nontoxic.  He is hemodynamically stable.  He is on 2 L oxygen, uses this PRN at home.  EKG without changes from prior.  Troponin 56.  CBC and chemistry overall reassuring.  CXR   11:37 PM Spoke with on call cardiology, they will see in consult.  Requested medical admission due to other medical co  morbidities. Final Clinical Impression(s) / ED Diagnoses Final diagnoses:  None    Rx / DC Orders ED Discharge Orders     None

## 2023-09-22 NOTE — ED Provider Triage Note (Signed)
Emergency Medicine Provider Triage Evaluation Note  Randy Webb. , a 62 y.o. male  was evaluated in triage.  Complaining of chest pain Review of Systems  Positive: Chest pain Negative: Fever  Physical Exam  BP 131/62 (BP Location: Left Arm)   Pulse 73   Temp 98.1 F (36.7 C)   Resp 18   Ht 6' (1.829 m)   Wt 75.3 kg   SpO2 100%   BMI 22.51 kg/m  Gen:   Awake, no distress   Resp:  Normal effort  MSK:   Moves extremities without difficulty  O  Medical Decision Making  Medically screening exam initiated at 9:58 PM.  Appropriate orders placed.  Randy Webb. was informed that the remainder of the evaluation will be completed by another provider, this initial triage assessment does not replace that evaluation, and the importance of remaining in the ED until their evaluation is complete.   Pt complains of chest pain patient presents with chest pain earlier today.  Was brief lasting a few seconds but states it felt like previous coronary artery disease..  Feeling better now.  States he did not have his nitroglycerin.  Also had been out of his oxygen earlier in the day.  States he took some aspirin and it went away.  EKG reassuring.  Will get blood work and x-ray.   Randy Core, MD 09/22/23 2159

## 2023-09-22 NOTE — ED Triage Notes (Signed)
Pt c/o chest pain that started 2 nights ago. Pt reports it was mild but got worse tonight with nausea.
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
## 2023-09-23 ENCOUNTER — Inpatient Hospital Stay (HOSPITAL_COMMUNITY)
Admission: EM | Disposition: A | Discharge status: Home / Self Care | Payer: Self-pay | Source: Home / Self Care | Attending: Family Medicine

## 2023-09-23 ENCOUNTER — Inpatient Hospital Stay (HOSPITAL_COMMUNITY): Payer: Medicaid Other

## 2023-09-23 DIAGNOSIS — R0989 Other specified symptoms and signs involving the circulatory and respiratory systems: Secondary | ICD-10-CM

## 2023-09-23 DIAGNOSIS — I214 Non-ST elevation (NSTEMI) myocardial infarction: Secondary | ICD-10-CM

## 2023-09-23 DIAGNOSIS — R079 Chest pain, unspecified: Secondary | ICD-10-CM | POA: Diagnosis present

## 2023-09-23 DIAGNOSIS — I130 Hypertensive heart and chronic kidney disease with heart failure and stage 1 through stage 4 chronic kidney disease, or unspecified chronic kidney disease: Secondary | ICD-10-CM | POA: Diagnosis present

## 2023-09-23 DIAGNOSIS — E1122 Type 2 diabetes mellitus with diabetic chronic kidney disease: Secondary | ICD-10-CM | POA: Diagnosis present

## 2023-09-23 DIAGNOSIS — N182 Chronic kidney disease, stage 2 (mild): Secondary | ICD-10-CM | POA: Diagnosis present

## 2023-09-23 DIAGNOSIS — Z79899 Other long term (current) drug therapy: Secondary | ICD-10-CM | POA: Diagnosis not present

## 2023-09-23 DIAGNOSIS — N179 Acute kidney failure, unspecified: Secondary | ICD-10-CM | POA: Diagnosis not present

## 2023-09-23 DIAGNOSIS — Z955 Presence of coronary angioplasty implant and graft: Secondary | ICD-10-CM | POA: Diagnosis not present

## 2023-09-23 DIAGNOSIS — N1831 Chronic kidney disease, stage 3a: Secondary | ICD-10-CM | POA: Diagnosis not present

## 2023-09-23 DIAGNOSIS — F17210 Nicotine dependence, cigarettes, uncomplicated: Secondary | ICD-10-CM | POA: Diagnosis present

## 2023-09-23 DIAGNOSIS — E538 Deficiency of other specified B group vitamins: Secondary | ICD-10-CM | POA: Diagnosis present

## 2023-09-23 DIAGNOSIS — G894 Chronic pain syndrome: Secondary | ICD-10-CM | POA: Diagnosis present

## 2023-09-23 DIAGNOSIS — I25110 Atherosclerotic heart disease of native coronary artery with unstable angina pectoris: Secondary | ICD-10-CM | POA: Diagnosis not present

## 2023-09-23 DIAGNOSIS — M51379 Other intervertebral disc degeneration, lumbosacral region without mention of lumbar back pain or lower extremity pain: Secondary | ICD-10-CM | POA: Diagnosis present

## 2023-09-23 DIAGNOSIS — E7800 Pure hypercholesterolemia, unspecified: Secondary | ICD-10-CM

## 2023-09-23 DIAGNOSIS — E039 Hypothyroidism, unspecified: Secondary | ICD-10-CM | POA: Diagnosis present

## 2023-09-23 DIAGNOSIS — I6521 Occlusion and stenosis of right carotid artery: Secondary | ICD-10-CM | POA: Diagnosis present

## 2023-09-23 DIAGNOSIS — J449 Chronic obstructive pulmonary disease, unspecified: Secondary | ICD-10-CM | POA: Diagnosis present

## 2023-09-23 DIAGNOSIS — Z23 Encounter for immunization: Secondary | ICD-10-CM | POA: Diagnosis not present

## 2023-09-23 DIAGNOSIS — I5022 Chronic systolic (congestive) heart failure: Secondary | ICD-10-CM | POA: Diagnosis present

## 2023-09-23 DIAGNOSIS — I252 Old myocardial infarction: Secondary | ICD-10-CM | POA: Diagnosis not present

## 2023-09-23 DIAGNOSIS — K219 Gastro-esophageal reflux disease without esophagitis: Secondary | ICD-10-CM | POA: Diagnosis present

## 2023-09-23 DIAGNOSIS — M150 Primary generalized (osteo)arthritis: Secondary | ICD-10-CM | POA: Diagnosis present

## 2023-09-23 DIAGNOSIS — I2510 Atherosclerotic heart disease of native coronary artery without angina pectoris: Secondary | ICD-10-CM

## 2023-09-23 DIAGNOSIS — I350 Nonrheumatic aortic (valve) stenosis: Secondary | ICD-10-CM | POA: Diagnosis present

## 2023-09-23 DIAGNOSIS — Z720 Tobacco use: Secondary | ICD-10-CM

## 2023-09-23 DIAGNOSIS — R7989 Other specified abnormal findings of blood chemistry: Secondary | ICD-10-CM | POA: Diagnosis present

## 2023-09-23 DIAGNOSIS — Z886 Allergy status to analgesic agent status: Secondary | ICD-10-CM | POA: Diagnosis not present

## 2023-09-23 DIAGNOSIS — I255 Ischemic cardiomyopathy: Secondary | ICD-10-CM | POA: Diagnosis present

## 2023-09-23 DIAGNOSIS — E782 Mixed hyperlipidemia: Secondary | ICD-10-CM | POA: Diagnosis present

## 2023-09-23 HISTORY — PX: LEFT HEART CATH AND CORONARY ANGIOGRAPHY: CATH118249

## 2023-09-23 HISTORY — DX: Chest pain, unspecified: R07.9

## 2023-09-23 HISTORY — DX: Other specified symptoms and signs involving the circulatory and respiratory systems: R09.89

## 2023-09-23 LAB — BRAIN NATRIURETIC PEPTIDE: B Natriuretic Peptide: 251.3 pg/mL — ABNORMAL HIGH (ref 0.0–100.0)

## 2023-09-23 LAB — BASIC METABOLIC PANEL
Anion gap: 10 (ref 5–15)
BUN: 8 mg/dL (ref 8–23)
CO2: 24 mmol/L (ref 22–32)
Calcium: 7.7 mg/dL — ABNORMAL LOW (ref 8.9–10.3)
Chloride: 107 mmol/L (ref 98–111)
Creatinine, Ser: 0.96 mg/dL (ref 0.61–1.24)
GFR, Estimated: >60 mL/min (ref >60)
Glucose, Bld: 89 mg/dL (ref 70–99)
Potassium: 3.5 mmol/L (ref 3.5–5.1)
Sodium: 141 mmol/L (ref 135–145)

## 2023-09-23 LAB — CBC
HCT: 36.7 % — ABNORMAL LOW (ref 39.0–52.0)
Hemoglobin: 12.3 g/dL — ABNORMAL LOW (ref 13.0–17.0)
MCH: 28.5 pg (ref 26.0–34.0)
MCHC: 33.5 g/dL (ref 30.0–36.0)
MCV: 85 fL (ref 80.0–100.0)
Platelets: 167 10*3/uL (ref 150–400)
RBC: 4.32 MIL/uL (ref 4.22–5.81)
RDW: 15.8 % — ABNORMAL HIGH (ref 11.5–15.5)
WBC: 8.6 10*3/uL (ref 4.0–10.5)
nRBC: 0 % (ref 0.0–0.2)

## 2023-09-23 LAB — CREATININE, SERUM
Creatinine, Ser: 1.19 mg/dL (ref 0.61–1.24)
Creatinine, Ser: 1.24 mg/dL (ref 0.61–1.24)
GFR, Estimated: >60 mL/min (ref >60)
GFR, Estimated: >60 mL/min (ref >60)

## 2023-09-23 LAB — HEPATIC FUNCTION PANEL
ALT: 15 U/L (ref 0–44)
AST: 13 U/L — ABNORMAL LOW (ref 15–41)
Albumin: 2.8 g/dL — ABNORMAL LOW (ref 3.5–5.0)
Alkaline Phosphatase: 55 U/L (ref 38–126)
Bilirubin, Direct: <0.1 mg/dL (ref 0.0–0.2)
Total Bilirubin: 0.1 mg/dL — ABNORMAL LOW (ref 0.3–1.2)
Total Protein: 4.8 g/dL — ABNORMAL LOW (ref 6.5–8.1)

## 2023-09-23 LAB — MAGNESIUM: Magnesium: 1.7 mg/dL (ref 1.7–2.4)

## 2023-09-23 LAB — HIV ANTIBODY (ROUTINE TESTING W REFLEX): HIV Screen 4th Generation wRfx: NONREACTIVE

## 2023-09-23 LAB — TROPONIN I (HIGH SENSITIVITY)
Troponin I (High Sensitivity): 73 ng/L — ABNORMAL HIGH (ref <18)
Troponin I (High Sensitivity): 87 ng/L — ABNORMAL HIGH (ref <18)
Troponin I (High Sensitivity): 89 ng/L — ABNORMAL HIGH (ref <18)

## 2023-09-23 SURGERY — LEFT HEART CATH AND CORONARY ANGIOGRAPHY
Anesthesia: LOCAL

## 2023-09-23 MED ORDER — NITROGLYCERIN 0.4 MG SL SUBL
0.4000 mg | SUBLINGUAL_TABLET | SUBLINGUAL | Status: DC | PRN
  Administered 2023-09-23 – 2023-09-24 (×4): 0.4 mg via SUBLINGUAL
  Filled 2023-09-23 (×2): qty 1

## 2023-09-23 MED ORDER — NIACIN ER (ANTIHYPERLIPIDEMIC) 500 MG PO TBCR
500.0000 mg | EXTENDED_RELEASE_TABLET | Freq: Every day | ORAL | Status: DC
  Administered 2023-09-23: 500 mg via ORAL
  Filled 2023-09-23 (×2): qty 1

## 2023-09-23 MED ORDER — MIDAZOLAM HCL 2 MG/2ML IJ SOLN
INTRAMUSCULAR | Status: AC
Start: 2023-09-23 — End: ?
  Filled 2023-09-23: qty 2

## 2023-09-23 MED ORDER — ENOXAPARIN SODIUM 40 MG/0.4ML IJ SOSY
40.0000 mg | PREFILLED_SYRINGE | INTRAMUSCULAR | Status: DC
  Administered 2023-09-23: 40 mg via SUBCUTANEOUS
  Filled 2023-09-23: qty 0.4

## 2023-09-23 MED ORDER — CLOPIDOGREL BISULFATE 75 MG PO TABS
75.0000 mg | ORAL_TABLET | Freq: Every day | ORAL | Status: DC
  Administered 2023-09-23 – 2023-09-25 (×3): 75 mg via ORAL
  Filled 2023-09-23 (×3): qty 1

## 2023-09-23 MED ORDER — SODIUM CHLORIDE 0.9 % WEIGHT BASED INFUSION: 3.0000 mL/kg/h | INTRAVENOUS | Status: AC

## 2023-09-23 MED ORDER — IOHEXOL 350 MG/ML SOLN
INTRAVENOUS | Status: DC | PRN
  Administered 2023-09-23: 25 mL

## 2023-09-23 MED ORDER — METOPROLOL SUCCINATE ER 25 MG PO TB24
25.0000 mg | ORAL_TABLET | Freq: Every day | ORAL | Status: DC
  Administered 2023-09-23 – 2023-09-25 (×3): 25 mg via ORAL
  Filled 2023-09-23 (×3): qty 1

## 2023-09-23 MED ORDER — RANOLAZINE ER 500 MG PO TB12: 500.0000 mg | ORAL_TABLET | Freq: Two times a day (BID) | ORAL | Status: DC

## 2023-09-23 MED ORDER — LIDOCAINE HCL (PF) 1 % IJ SOLN
INTRAMUSCULAR | Status: AC
  Filled 2023-09-23: qty 30

## 2023-09-23 MED ORDER — SODIUM CHLORIDE 0.9 % IV SOLN
INTRAVENOUS | Status: AC
Start: 2023-09-23 — End: 2023-09-23

## 2023-09-23 MED ORDER — MIDAZOLAM HCL 2 MG/2ML IJ SOLN
INTRAMUSCULAR | Status: DC | PRN
  Administered 2023-09-23 (×2): 1 mg via INTRAVENOUS

## 2023-09-23 MED ORDER — SODIUM CHLORIDE 0.9 % WEIGHT BASED INFUSION: 1.0000 mL/kg/h | INTRAVENOUS | Status: DC

## 2023-09-23 MED ORDER — ATORVASTATIN CALCIUM 80 MG PO TABS
80.0000 mg | ORAL_TABLET | Freq: Every day | ORAL | Status: DC
  Administered 2023-09-23 – 2023-09-25 (×3): 80 mg via ORAL
  Filled 2023-09-23 (×2): qty 1
  Filled 2023-09-23: qty 2

## 2023-09-23 MED ORDER — SODIUM CHLORIDE 0.9% FLUSH
3.0000 mL | Freq: Two times a day (BID) | INTRAVENOUS | Status: DC
  Administered 2023-09-23 – 2023-09-25 (×4): 3 mL via INTRAVENOUS

## 2023-09-23 MED ORDER — PNEUMOCOCCAL 20-VAL CONJ VACC 0.5 ML IM SUSY
0.5000 mL | PREFILLED_SYRINGE | INTRAMUSCULAR | Status: AC
  Administered 2023-09-25: 0.5 mL via INTRAMUSCULAR
  Filled 2023-09-23: qty 0.5

## 2023-09-23 MED ORDER — EZETIMIBE 10 MG PO TABS
10.0000 mg | ORAL_TABLET | Freq: Every day | ORAL | Status: DC
  Administered 2023-09-23 – 2023-09-25 (×3): 10 mg via ORAL
  Filled 2023-09-23 (×3): qty 1

## 2023-09-23 MED ORDER — FENTANYL CITRATE (PF) 100 MCG/2ML IJ SOLN
INTRAMUSCULAR | Status: AC
  Filled 2023-09-23: qty 2

## 2023-09-23 MED ORDER — ACETAMINOPHEN 325 MG PO TABS
650.0000 mg | ORAL_TABLET | Freq: Four times a day (QID) | ORAL | Status: DC | PRN
  Administered 2023-09-24: 650 mg via ORAL
  Filled 2023-09-23: qty 2

## 2023-09-23 MED ORDER — MELATONIN 5 MG PO TABS
5.0000 mg | ORAL_TABLET | Freq: Every evening | ORAL | Status: DC | PRN
  Administered 2023-09-23 – 2023-09-24 (×2): 5 mg via ORAL
  Filled 2023-09-23 (×2): qty 1

## 2023-09-23 MED ORDER — FENTANYL CITRATE (PF) 100 MCG/2ML IJ SOLN
INTRAMUSCULAR | Status: DC | PRN
  Administered 2023-09-23 (×2): 25 ug via INTRAVENOUS

## 2023-09-23 MED ORDER — VERAPAMIL HCL 2.5 MG/ML IV SOLN
INTRAVENOUS | Status: AC
  Filled 2023-09-23: qty 2

## 2023-09-23 MED ORDER — ALBUTEROL SULFATE (2.5 MG/3ML) 0.083% IN NEBU: 2.5000 mg | INHALATION_SOLUTION | RESPIRATORY_TRACT | Status: DC | PRN

## 2023-09-23 MED ORDER — LIDOCAINE HCL (PF) 1 % IJ SOLN
INTRAMUSCULAR | Status: DC | PRN
  Administered 2023-09-23: 10 mL
  Administered 2023-09-23: 2 mL

## 2023-09-23 MED ORDER — ASPIRIN 81 MG PO CHEW
81.0000 mg | CHEWABLE_TABLET | Freq: Every day | ORAL | Status: DC
  Administered 2023-09-23: 81 mg via ORAL
  Filled 2023-09-23: qty 1

## 2023-09-23 MED ORDER — RANOLAZINE ER 500 MG PO TB12
1000.0000 mg | ORAL_TABLET | Freq: Two times a day (BID) | ORAL | Status: DC
  Administered 2023-09-23 (×2): 1000 mg via ORAL
  Filled 2023-09-23 (×2): qty 2

## 2023-09-23 MED ORDER — PROCHLORPERAZINE EDISYLATE 10 MG/2ML IJ SOLN: 5.0000 mg | Freq: Four times a day (QID) | INTRAMUSCULAR | Status: DC | PRN

## 2023-09-23 MED ORDER — PHENYLEPHRINE 80 MCG/ML (10ML) SYRINGE FOR IV PUSH (FOR BLOOD PRESSURE SUPPORT)
PREFILLED_SYRINGE | INTRAVENOUS | Status: AC
  Filled 2023-09-23: qty 10

## 2023-09-23 MED ORDER — HEPARIN (PORCINE) IN NACL 1000-0.9 UT/500ML-% IV SOLN
INTRAVENOUS | Status: DC | PRN
  Administered 2023-09-23 (×2): 500 mL

## 2023-09-23 MED ORDER — SODIUM CHLORIDE 0.9 % IV SOLN
INTRAVENOUS | Status: AC | PRN
  Administered 2023-09-23: 10 mL/h via INTRAVENOUS

## 2023-09-23 MED ORDER — POLYETHYLENE GLYCOL 3350 17 G PO PACK: 17.0000 g | PACK | Freq: Every day | ORAL | Status: DC | PRN

## 2023-09-23 MED ORDER — LABETALOL HCL 5 MG/ML IV SOLN
10.0000 mg | INTRAVENOUS | Status: AC | PRN
Start: 2023-09-23 — End: 2023-09-24

## 2023-09-23 MED ORDER — HYDRALAZINE HCL 20 MG/ML IJ SOLN: 10.0000 mg | INTRAMUSCULAR | Status: AC | PRN

## 2023-09-23 MED ORDER — PANTOPRAZOLE SODIUM 40 MG PO TBEC
40.0000 mg | DELAYED_RELEASE_TABLET | Freq: Every day | ORAL | Status: DC
  Administered 2023-09-23 – 2023-09-25 (×3): 40 mg via ORAL
  Filled 2023-09-23 (×3): qty 1

## 2023-09-23 MED ORDER — HEPARIN SODIUM (PORCINE) 1000 UNIT/ML IJ SOLN
INTRAMUSCULAR | Status: AC
  Filled 2023-09-23: qty 10

## 2023-09-23 SURGICAL SUPPLY — 16 items
BAG SNAP BAND KOVER 36X36 (MISCELLANEOUS) IMPLANT
CATH BALLN WEDGE 5F 110CM (CATHETERS) IMPLANT
CATH INFINITI 5 FR JL3.5 (CATHETERS) IMPLANT
CATH INFINITI 5FR MULTPACK ANG (CATHETERS) IMPLANT
CLOSURE MYNX CONTROL 5F (Vascular Products) IMPLANT
DEVICE RAD COMP TR BAND LRG (VASCULAR PRODUCTS) IMPLANT
GLIDESHEATH SLEND SS 6F .021 (SHEATH) IMPLANT
GUIDEWIRE INQWIRE 1.5J.035X260 (WIRE) IMPLANT
INQWIRE 1.5J .035X260CM (WIRE) ×1
KIT MICROPUNCTURE NIT STIFF (SHEATH) IMPLANT
PACK CARDIAC CATHETERIZATION (CUSTOM PROCEDURE TRAY) ×1 IMPLANT
SET ATX-X65L (MISCELLANEOUS) IMPLANT
SHEATH GLIDE SLENDER 4/5FR (SHEATH) IMPLANT
SHEATH PINNACLE 5F 10CM (SHEATH) IMPLANT
SHEATH PROBE COVER 6X72 (BAG) IMPLANT
WIRE EMERALD 3MM-J .035X150CM (WIRE) IMPLANT

## 2023-09-23 NOTE — ED Notes (Signed)
Assumed care of pt at this time.

## 2023-09-23 NOTE — Progress Notes (Signed)
   Patient Name: Randy Webb. Date of Encounter: 09/23/2023 [facility] Cardiologist: Gypsy Balsam, MD   Interval Summary  .    No further episodes of chest pain this morning. Reports pain resolved with SL NTG.   Vital Signs .    Vitals:   09/23/23 0615 09/23/23 0630 09/23/23 0642 09/23/23 0700  BP:    134/61  Pulse: 67 70  74  Resp: 17 15  17   Temp:   98.3 F (36.8 C)   TempSrc:   Oral   SpO2: 100% 100%  100%  Weight:      Height:       No intake or output data in the 24 hours ending 09/23/23 0819    09/22/2023    9:48 PM 07/16/2023    1:04 PM 04/15/2023   12:02 PM  Last 3 Weights  Weight (lbs) 166 lb 163 lb 3.2 oz 175 lb 6.4 oz  Weight (kg) 75.297 kg 74.027 kg 79.561 kg      Telemetry/ECG    Sinus Rhythm - Personally Reviewed  Physical Exam .   GEN: No acute distress.   Neck: No JVD Cardiac: RRR, + soft systolic murmur, no rubs, or gallops.  Respiratory: Clear to auscultation bilaterally. GI: Soft, nontender, non-distended  MS: No edema  Assessment & Plan .     62 y.o. male with a hx of CAD s/p LCx PCI (2007) and mid LAD PCI (12/2022) with CTO proximal RCA, T2DM, active smoker, HLD who is being seen 09/22/2023 for the evaluation of chest pain at the request of [facility].   NSTEMI CAD s/p PCI of LCx/LAD and CTO of RCA -- presented with chest pain that was intermittent for the past 2 days, then developed worsening episode while walking to the mailbox yesterday. Improved with rest, but then returned with activity. Reports compliance with medications, but appears he has been out of his plavix for at least several days. Unclear what he was actually taking PTA. Pending Med REC -- hsTn 56>>73>>87>>89, EKG without ischemic changes -- given concern for ACS (known underlying disease), will plan for cardiac catheterization to determine if further progression of CAD -- continue ASA, plavix, atorvastatin 80mg  daily, Zetia 10mg  daily, ranexa (was  increased to 1000mg  BID overnight). BP is soft currently, will add back therapy as tolerated  Informed Consent   Shared Decision Making/Informed Consent The risks [stroke (1 in 1000), death (1 in 1000), kidney failure [usually temporary] (1 in 500), bleeding (1 in 200), allergic reaction [possibly serious] (1 in 200)], benefits (diagnostic support and management of coronary artery disease) and alternatives of a cardiac catheterization were discussed in detail with Mr. Gerges and he is willing to proceed.  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 Follow BP and add additional therapy as tolerated  -- check echo  HLD -- on atorvastatin 80mg  daily (does not appear he was taking) -- check lipids  PreDM -- Hgb A1c 6.1 12/2022 -- update A1c  Tobacco use -- continues to smoke 1 ppd  For questions or updates, please contact [facility] Please consult [email or web address] for contact info under        Signed, Laverda Page, NP

## 2023-09-23 NOTE — ED Notes (Signed)
Report received from Whitney E. RN.

## 2023-09-23 NOTE — Plan of Care (Signed)

## 2023-09-23 NOTE — Progress Notes (Signed)
Carotid artery duplex has been completed. Preliminary results can be found in CV Proc through chart review.   09/23/23 3:47 PM Olen Cordial RVT

## 2023-09-23 NOTE — Progress Notes (Signed)
PROGRESS NOTE    Randy Webb.  VHQ:[id number] DOB: 1961-02-22 DOA: 09/22/2023 PCP: Galvin Proffer, MD   Brief Narrative:  Randy Webb. is a 62 y.o. male with medical history significant for nocturnal hypoxia on 2 L nightly, coronary artery disease status post PCI with drug-eluting stent placement on 12/18/2022, on DAPT aspirin and Plavix (cards recommended at least 12 months) with noncompliance, COPD, current tobacco use disorder, ongoing vaping, hyperlipidemia, ischemic cardiomyopathy with LVEF 35 to 45%, hypothyroidism, type 2 diabetes, GERD.  Patient presents after 2 days of chest pain, initially exertional now somewhat intermittent even notable at rest.  Hospitalist called for admission, cardiology called in consult.  Assessment & Plan:   Principal Problem:   Chest pain  Chest pain rule out ACS Coronary artery disease status post PCI with stenting to mid LAD 12/2022 Ischemic cardiomyopathy with LVEF 35 to 45% -Known history of CAD status post drug-eluting stent to mid LAD January 2024 with poor medication compliance, reports missing Plavix for nearly a week prior to admission -Cardiology following, previously recommending dual antiplatelet therapy, given noncompliance there is concern for stent thrombosis, cardiac catheterization pending further discussion with patient -Troponin minimally elevated, downtrending appropriately, EKG without overt ischemic changes -Echocardiogram pending -Patient continues to smoke/vape despite recommendations   Hyperlipidemia, profound -Lipid panel August 2024 markedly abnormal with elevated cholesterol triglyceride and LDL with low HDL -Continue home statin, Zetia, niacin -concern for noncompliance with medication and diet given abnormal lipid panel  Tobacco use disorder, vaping, ongoing Recommended complete cessation.  HFrEF 35 to 45% Strict I's and O's and daily weight Resume home cardiac medications as blood pressure  tolerates Follow repeat 2D echo  DVT prophylaxis: enoxaparin (LOVENOX) injection 40 mg Start: 09/23/23 1000 Code Status:   Code Status: Full Code  Family Communication: None present  Status is: Inpatient  Dispo: The patient is from: Home              Anticipated d/c is to: Home              Anticipated d/c date is: 24 to 48 hours              Patient currently not medically stable for discharge  Consultants:  Cardiology  Procedures:  Pending cardiology evaluation, possible cardiac catheterization  Antimicrobials:  None indicated  Subjective: No acute issues or events overnight, chest pain improving otherwise denies nausea vomiting diarrhea constipation headache fevers chills or shortness of breath  Objective: Vitals:   09/23/23 0615 09/23/23 0630 09/23/23 0642 09/23/23 0700  BP:    134/61  Pulse: 67 70  74  Resp: 17 15  17   Temp:   98.3 F (36.8 C)   TempSrc:   Oral   SpO2: 100% 100%  100%  Weight:      Height:       No intake or output data in the 24 hours ending 09/23/23 0822 Filed Weights   09/22/23 2148  Weight: 75.3 kg    Examination:  General:  Pleasantly resting in bed, No acute distress. HEENT:  Normocephalic atraumatic.  Sclerae nonicteric, noninjected.  Extraocular movements intact bilaterally. Neck:  Without mass or deformity.  Trachea is midline. Lungs:  Clear to auscultate bilaterally without rhonchi, wheeze, or rales. Heart:  Regular rate and rhythm.  Without murmurs, rubs, or gallops. Abdomen:  Soft, nontender, nondistended.  Without guarding or rebound. Extremities: Without cyanosis, clubbing, edema, or obvious deformity. Skin:  Warm and dry, no erythema.  Data Reviewed: I have personally reviewed following labs and imaging studies  CBC: Recent Labs  Lab 09/22/23 2155 09/23/23 0306  WBC 8.4 8.6  HGB 13.6 12.3*  HCT 41.1 36.7*  MCV 85.1 85.0  PLT 184 167   Basic Metabolic Panel: Recent Labs  Lab 09/22/23 2155 09/23/23 0306  NA  136  --   K 3.9  --   CL 99  --   CO2 27  --   GLUCOSE 98  --   BUN 8  --   CREATININE 1.16 1.19  CALCIUM 8.9  --    GFR: Estimated Creatinine Clearance: 68.6 mL/min (by C-G formula based on SCr of 1.19 mg/dL).  BNP (last 3 results) Recent Labs    04/15/23 1325  PROBNP 2,128*   No results found for this or any previous visit (from the past 240 hour(s)).   Radiology Studies: DG Chest 2 View  Result Date: 09/22/2023 CLINICAL DATA:  Chest pain for 2 nights, initial encounter EXAM: CHEST - 2 VIEW COMPARISON:  06/15/2023 FINDINGS: The heart size and mediastinal contours are within normal limits. Both lungs are clear. The visualized skeletal structures are unremarkable. Bilateral nipple shadows are noted. IMPRESSION: No active cardiopulmonary disease. Electronically Signed   By: Alcide Clever M.D.   On: 09/22/2023 23:43    Scheduled Meds:  aspirin  81 mg Oral Daily   atorvastatin  80 mg Oral Daily   clopidogrel  75 mg Oral Daily   enoxaparin (LOVENOX) injection  40 mg Subcutaneous Q24H   ezetimibe  10 mg Oral Daily   niacin  500 mg Oral QHS   pantoprazole  40 mg Oral Daily   ranolazine  1,000 mg Oral BID   Continuous Infusions:   LOS: 0 days   Time spent:  Azucena Fallen, DO [facility]  If 7PM-7AM, please contact night-coverage [email or web address]  09/23/2023, 8:22 AM

## 2023-09-23 NOTE — H&P (View-Only) (Signed)
   Patient Name: Randy Webb. Date of Encounter: 09/23/2023 [facility] Cardiologist: Gypsy Balsam, MD   Interval Summary  .    No further episodes of chest pain this morning. Reports pain resolved with SL NTG.   Vital Signs .    Vitals:   09/23/23 0615 09/23/23 0630 09/23/23 0642 09/23/23 0700  BP:    134/61  Pulse: 67 70  74  Resp: 17 15  17   Temp:   98.3 F (36.8 C)   TempSrc:   Oral   SpO2: 100% 100%  100%  Weight:      Height:       No intake or output data in the 24 hours ending 09/23/23 0819    09/22/2023    9:48 PM 07/16/2023    1:04 PM 04/15/2023   12:02 PM  Last 3 Weights  Weight (lbs) 166 lb 163 lb 3.2 oz 175 lb 6.4 oz  Weight (kg) 75.297 kg 74.027 kg 79.561 kg      Telemetry/ECG    Sinus Rhythm - Personally Reviewed  Physical Exam .   GEN: No acute distress.   Neck: No JVD Cardiac: RRR, + soft systolic murmur, no rubs, or gallops.  Respiratory: Clear to auscultation bilaterally. GI: Soft, nontender, non-distended  MS: No edema  Assessment & Plan .     62 y.o. male with a hx of CAD s/p LCx PCI (2007) and mid LAD PCI (12/2022) with CTO proximal RCA, T2DM, active smoker, HLD who is being seen 09/22/2023 for the evaluation of chest pain at the request of [facility].   NSTEMI CAD s/p PCI of LCx/LAD and CTO of RCA -- presented with chest pain that was intermittent for the past 2 days, then developed worsening episode while walking to the mailbox yesterday. Improved with rest, but then returned with activity. Reports compliance with medications, but appears he has been out of his plavix for at least several days. Unclear what he was actually taking PTA. Pending Med REC -- hsTn 56>>73>>87>>89, EKG without ischemic changes -- given concern for ACS (known underlying disease), will plan for cardiac catheterization to determine if further progression of CAD -- continue ASA, plavix, atorvastatin 80mg  daily, Zetia 10mg  daily, ranexa (was  increased to 1000mg  BID overnight). BP is soft currently, will add back therapy as tolerated  Informed Consent   Shared Decision Making/Informed Consent The risks [stroke (1 in 1000), death (1 in 1000), kidney failure [usually temporary] (1 in 500), bleeding (1 in 200), allergic reaction [possibly serious] (1 in 200)], benefits (diagnostic support and management of coronary artery disease) and alternatives of a cardiac catheterization were discussed in detail with Mr. Gerges and he is willing to proceed.  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 Follow BP and add additional therapy as tolerated  -- check echo  HLD -- on atorvastatin 80mg  daily (does not appear he was taking) -- check lipids  PreDM -- Hgb A1c 6.1 12/2022 -- update A1c  Tobacco use -- continues to smoke 1 ppd  For questions or updates, please contact [facility] Please consult [email or web address] for contact info under        Signed, Laverda Page, NP

## 2023-09-23 NOTE — ED Notes (Signed)
MD Nevada Crane at bedside
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
## 2023-09-23 NOTE — Interval H&P Note (Signed)
History and Physical Interval Note:  09/23/2023 5:07 PM  Randy Webb.  has presented today for surgery, with the diagnosis of unstable angina.  The various methods of treatment have been discussed with the patient and family. After consideration of risks, benefits and other options for treatment, the patient has consented to  Procedure(s): LEFT HEART CATH AND CORONARY ANGIOGRAPHY (N/A) as a surgical intervention.  The patient's history has been reviewed, patient examined, no change in status, stable for surgery.  I have reviewed the patient's chart and labs.  Questions were answered to the patient's satisfaction.     Leiyah Maultsby J Cylah Fannin

## 2023-09-24 ENCOUNTER — Encounter (HOSPITAL_COMMUNITY): Payer: Self-pay | Admitting: Cardiology

## 2023-09-24 ENCOUNTER — Inpatient Hospital Stay (HOSPITAL_COMMUNITY): Payer: Medicaid Other

## 2023-09-24 ENCOUNTER — Other Ambulatory Visit (HOSPITAL_COMMUNITY): Payer: Self-pay

## 2023-09-24 DIAGNOSIS — I6521 Occlusion and stenosis of right carotid artery: Secondary | ICD-10-CM

## 2023-09-24 DIAGNOSIS — Z91148 Patient's other noncompliance with medication regimen for other reason: Secondary | ICD-10-CM

## 2023-09-24 DIAGNOSIS — I25110 Atherosclerotic heart disease of native coronary artery with unstable angina pectoris: Secondary | ICD-10-CM | POA: Diagnosis not present

## 2023-09-24 DIAGNOSIS — R079 Chest pain, unspecified: Secondary | ICD-10-CM

## 2023-09-24 DIAGNOSIS — E7800 Pure hypercholesterolemia, unspecified: Secondary | ICD-10-CM | POA: Diagnosis not present

## 2023-09-24 DIAGNOSIS — R7989 Other specified abnormal findings of blood chemistry: Secondary | ICD-10-CM | POA: Diagnosis not present

## 2023-09-24 DIAGNOSIS — Z720 Tobacco use: Secondary | ICD-10-CM | POA: Diagnosis not present

## 2023-09-24 HISTORY — DX: Occlusion and stenosis of right carotid artery: I65.21

## 2023-09-24 LAB — ECHOCARDIOGRAM COMPLETE
AR max vel: 0.95 cm2
AV Area VTI: 1.17 cm2
AV Area mean vel: 1.1 cm2
AV Mean grad: 12 mm[Hg]
AV Peak grad: 23 mm[Hg]
Ao pk vel: 2.4 m/s
Area-P 1/2: 4.01 cm2
Calc EF: 36.4 %
Height: 72 in
MV VTI: 1.69 cm2
S' Lateral: 4.4 cm
Single Plane A2C EF: 34.7 %
Single Plane A4C EF: 36.5 %
Weight: 2656 [oz_av]

## 2023-09-24 LAB — LIPID PANEL
Cholesterol: 231 mg/dL — ABNORMAL HIGH (ref 0–200)
HDL: 30 mg/dL — ABNORMAL LOW (ref >40)
LDL Cholesterol: 173 mg/dL — ABNORMAL HIGH (ref 0–99)
Total CHOL/HDL Ratio: 7.7 {ratio}
Triglycerides: 140 mg/dL (ref <150)
VLDL: 28 mg/dL (ref 0–40)

## 2023-09-24 LAB — BASIC METABOLIC PANEL
Anion gap: 10 (ref 5–15)
BUN: 11 mg/dL (ref 8–23)
CO2: 23 mmol/L (ref 22–32)
Calcium: 9.1 mg/dL (ref 8.9–10.3)
Chloride: 104 mmol/L (ref 98–111)
Creatinine, Ser: 1.35 mg/dL — ABNORMAL HIGH (ref 0.61–1.24)
GFR, Estimated: 59 mL/min — ABNORMAL LOW (ref >60)
Glucose, Bld: 109 mg/dL — ABNORMAL HIGH (ref 70–99)
Potassium: 4.3 mmol/L (ref 3.5–5.1)
Sodium: 137 mmol/L (ref 135–145)

## 2023-09-24 LAB — CBC
HCT: 37.6 % — ABNORMAL LOW (ref 39.0–52.0)
Hemoglobin: 12.6 g/dL — ABNORMAL LOW (ref 13.0–17.0)
MCH: 28.2 pg (ref 26.0–34.0)
MCHC: 33.5 g/dL (ref 30.0–36.0)
MCV: 84.1 fL (ref 80.0–100.0)
Platelets: 161 10*3/uL (ref 150–400)
RBC: 4.47 MIL/uL (ref 4.22–5.81)
RDW: 15.8 % — ABNORMAL HIGH (ref 11.5–15.5)
WBC: 9.4 10*3/uL (ref 4.0–10.5)
nRBC: 0 % (ref 0.0–0.2)

## 2023-09-24 LAB — HEMOGLOBIN A1C
Hgb A1c MFr Bld: 5.7 % — ABNORMAL HIGH (ref 4.8–5.6)
Mean Plasma Glucose: 116.89 mg/dL

## 2023-09-24 LAB — CREATININE, SERUM
Creatinine, Ser: 1.48 mg/dL — ABNORMAL HIGH (ref 0.61–1.24)
GFR, Estimated: 53 mL/min — ABNORMAL LOW (ref >60)

## 2023-09-24 LAB — MAGNESIUM: Magnesium: 2.1 mg/dL (ref 1.7–2.4)

## 2023-09-24 MED ORDER — GABAPENTIN 300 MG PO CAPS
300.0000 mg | ORAL_CAPSULE | Freq: Three times a day (TID) | ORAL | Status: DC
  Administered 2023-09-24 – 2023-09-25 (×2): 300 mg via ORAL
  Filled 2023-09-24 (×2): qty 1

## 2023-09-24 MED ORDER — HEPARIN SODIUM (PORCINE) 5000 UNIT/ML IJ SOLN
5000.0000 [IU] | Freq: Three times a day (TID) | INTRAMUSCULAR | Status: DC
  Administered 2023-09-24 (×4): 5000 [IU] via SUBCUTANEOUS
  Filled 2023-09-24 (×4): qty 1

## 2023-09-24 MED ORDER — ASPIRIN 81 MG PO TBEC
81.0000 mg | DELAYED_RELEASE_TABLET | Freq: Every day | ORAL | Status: DC
  Administered 2023-09-24 – 2023-09-25 (×2): 81 mg via ORAL
  Filled 2023-09-24 (×2): qty 1

## 2023-09-24 MED ORDER — ISOSORBIDE MONONITRATE ER 30 MG PO TB24
30.0000 mg | ORAL_TABLET | Freq: Every day | ORAL | Status: DC
  Administered 2023-09-25: 30 mg via ORAL
  Filled 2023-09-24 (×2): qty 1

## 2023-09-24 MED ORDER — GABAPENTIN 100 MG PO CAPS: 100.0000 mg | ORAL_CAPSULE | Freq: Three times a day (TID) | ORAL | Status: DC

## 2023-09-24 MED FILL — henylephrine-NaCl Pref Syr 0.8 MG/10ML-0.9% (80 MCG/ML): INTRAVENOUS | Qty: 10 | Status: AC

## 2023-09-24 MED FILL — Heparin Sodium (Porcine) Inj 1000 Unit/ML: INTRAMUSCULAR | Qty: 10 | Status: AC

## 2023-09-24 MED FILL — Verapamil HCl IV Soln 2.5 MG/ML: INTRAVENOUS | Qty: 2 | Status: AC

## 2023-09-24 NOTE — TOC Benefit Eligibility Note (Signed)
Patient Product/process development scientist completed.    The patient is insured through [facility] MEDICAID.     Ran test claim for Entresto 24-26 mg and the current 30 day co-pay is $4.00.  Ran test claim for Farxiga 10 mg and Requires Prior Authorization  Ran test claim for Jardiance 10 mg and Requires Prior Authorization  This test claim was processed through [employer]- copay amounts may vary at other pharmacies due to [employer], or as the patient moves through the different stages of their insurance plan.     Roland Earl, CPHT Pharmacy Technician III Certified Patient Advocate [facility] Pharmacy Patient Advocate Team Direct Number: [phone]  Fax: [phone]

## 2023-09-24 NOTE — Plan of Care (Signed)
No acute changes noted.

## 2023-09-24 NOTE — Progress Notes (Signed)
   Patient Name: Randy Webb. Date of Encounter: 09/24/2023 [facility] Cardiologist: Gypsy Balsam, MD   Interval Summary  .    No chest pain, sitting up in bed.   Vital Signs .    Vitals:   09/24/23 0046 09/24/23 0314 09/24/23 0400 09/24/23 0734  BP: (!) 147/65 (!) 110/57    Pulse: 88 70    Resp: 16 16  18   Temp:  98.6 F (37 C)  98.2 F (36.8 C)  TempSrc:  Oral  Oral  SpO2: 94% 96% 97%   Weight:  75.3 kg    Height:        Intake/Output Summary (Last 24 hours) at 09/24/2023 0817 Last data filed at 09/24/2023 0600 Gross per 24 hour  Intake 490 ml  Output 1250 ml  Net -760 ml      09/24/2023    3:14 AM 09/22/2023    9:48 PM 07/16/2023    1:04 PM  Last 3 Weights  Weight (lbs) 166 lb 166 lb 163 lb 3.2 oz  Weight (kg) 75.297 kg 75.297 kg 74.027 kg      Telemetry/ECG    Sinus Rhythm - Personally Reviewed  Physical Exam .   GEN: No acute distress.   Neck: No JVD Cardiac: RRR, no murmurs, rubs, or gallops.  Respiratory: Clear to auscultation bilaterally. GI: Soft, nontender, non-distended  MS: No edema. Right radial cath site stable.   Assessment & Plan .     62 y.o. male with a hx of CAD s/p LCx PCI (2007) and mid LAD PCI (12/2022) with CTO proximal RCA, T2DM, active smoker, HLD who is being seen 09/22/2023 for the evaluation of chest pain at the request of [facility].    NSTEMI CAD s/p PCI of LCx/LAD and CTO of RCA -- presented with chest pain that was intermittent for the past 2 days, then developed worsening episode while walking to the mailbox yesterday. Improved with rest, but then returned with activity. Reported compliance with medications, but appeared he has been out of his plavix for at least several days. Unclear what he was actually taking PTA.  -- hsTn 56>>73>>87>>89, EKG without ischemic changes -- Cardiac catheterization 10/22 with multivessel CAD, no significant change compared to previous cath 12/2022 with no new culprit  lesion.  Recommendations to continue DAPT for 1 year versus aspirin monotherapy.  Will continue aspirin/Plavix as he is still within the 1 year window of his last intervention 12/2022.  -- continue ASA, plavix, atorvastatin 80mg  daily, Zetia 10mg  daily.  Will stop Ranexa as it does not appear he was even taking this prior to admission.    HFmrEF -- echo 03/2023 LVEF of 45-50%, home medication regimen is unclear -- does not appear volume overloaded on exam -- Echocardiogram pending -- GDMT: BP remains on the softer side, is tolerating metoprolol XL 25 mg daily.  Will consider additional therapy pending EF   HLD -- LDL 173, HDL 30 -- Has been resumed on atorvastatin 80 mg daily   PreDM -- Hemoglobin A1c 5.7   Tobacco use -- continues to smoke 1 ppd  For questions or updates, please contact [facility] Please consult [email or web address] for contact info under        Signed, Laverda Page, NP

## 2023-09-24 NOTE — Progress Notes (Signed)
PROGRESS NOTE    Randy Webb.  KGM:[id number] DOB: 06/22/1961 DOA: 09/22/2023 PCP: Galvin Proffer, MD   Brief Narrative: No notes on file   Assessment and Plan:  Chest pain Present on admission. Concerning in setting of underlying CAD. Troponin elevation with peak of 89. Cardiology consulted and performed cardiac catheterization which was significant for multivessel disease unchanged from previous catheterization. -Cardiology recommendations: Imdur, aspirin, Plavix  Tobacco use Vaping Ongoing. Recommendation for complete cessation on admission.  CAD History of PCI with stent placement. Cardiac catheterization performed this admission significant for multivessel CAD without significant change from previous catheterization from January 2024. Cardiology recommendation to continue DAPT to finish out one year of treatment followed by aspirin monotherapy.  AKI Baseline creatinine of 1-1.1. Creatinine up to 1.48 this evening. In setting of cardiac catheterization. -Repeat BMP in AM  Hyperlipidemia -Continue Zetia and Lipitor  Ischemic cardiomyopathy Chronic heart failure with reduced EF LVEF of 35%. -Continue metoprolol succinate  DVT prophylaxis: Heparin Code Status:   Code Status: Full Code Family Communication: None at bedside Disposition Plan: Discharge likely in AM pending stable creatinine   Consultants:  Cardiology  Procedures:  Cardiac catheterization Transthoracic Echocardiogram   Antimicrobials: None    Subjective: Patient reports no chest pain at rest. Hoping to get up and move today.  Objective: BP 131/66 (BP Location: Left Arm)   Pulse 72   Temp 98.9 F (37.2 C) (Oral)   Resp 17   Ht 6' (1.829 m)   Wt 75.3 kg   SpO2 95%   BMI 22.51 kg/m   Examination:  General exam: Appears calm and comfortable Respiratory system: Clear to auscultation. Respiratory effort normal. Cardiovascular system: S1 & S2 heard, RRR. No  murmurs. Gastrointestinal system: Abdomen is nondistended, soft and nontender. Normal bowel sounds heard. Central nervous system: Alert and oriented. No focal neurological deficits. Psychiatry: Judgement and insight appear normal. Mood & affect appropriate.    Data Reviewed: I have personally reviewed following labs and imaging studies  CBC Lab Results  Component Value Date   WBC 9.4 09/24/2023   RBC 4.47 09/24/2023   HGB 12.6 (L) 09/24/2023   HCT 37.6 (L) 09/24/2023   MCV 84.1 09/24/2023   MCH 28.2 09/24/2023   PLT 161 09/24/2023   MCHC 33.5 09/24/2023   RDW 15.8 (H) 09/24/2023   LYMPHSABS 2.2 06/28/2015   MONOABS 2.1 (H) 06/28/2015   EOSABS 0.0 06/28/2015   BASOSABS 0.0 06/28/2015     Last metabolic panel Lab Results  Component Value Date   NA 137 09/24/2023   K 4.3 09/24/2023   CL 104 09/24/2023   CO2 23 09/24/2023   BUN 11 09/24/2023   CREATININE 1.48 (H) 09/24/2023   GLUCOSE 109 (H) 09/24/2023   GFRNONAA 53 (L) 09/24/2023   GFRAA >60 07/01/2015   CALCIUM 9.1 09/24/2023   PROT 4.8 (L) 09/23/2023   ALBUMIN 2.8 (L) 09/23/2023   BILITOT 0.1 (L) 09/23/2023   ALKPHOS 55 09/23/2023   AST 13 (L) 09/23/2023   ALT 15 09/23/2023   ANIONGAP 10 09/24/2023    GFR: Estimated Creatinine Clearance: 55.1 mL/min (A) (by C-G formula based on SCr of 1.48 mg/dL (H)).  No results found for this or any previous visit (from the past 240 hour(s)).    Radiology Studies: CARDIAC CATHETERIZATION  Addendum Date: 09/24/2023   Coronary angiography 09/23/2023: LM: Minimal disease LAD: Ostial prox 20-30% disease          Patent mid ALD stent  PROGRESS NOTE    Randy Webb.  KGM:[id number] DOB: 06/22/1961 DOA: 09/22/2023 PCP: Galvin Proffer, MD   Brief Narrative: No notes on file   Assessment and Plan:  Chest pain Present on admission. Concerning in setting of underlying CAD. Troponin elevation with peak of 89. Cardiology consulted and performed cardiac catheterization which was significant for multivessel disease unchanged from previous catheterization. -Cardiology recommendations: Imdur, aspirin, Plavix  Tobacco use Vaping Ongoing. Recommendation for complete cessation on admission.  CAD History of PCI with stent placement. Cardiac catheterization performed this admission significant for multivessel CAD without significant change from previous catheterization from January 2024. Cardiology recommendation to continue DAPT to finish out one year of treatment followed by aspirin monotherapy.  AKI Baseline creatinine of 1-1.1. Creatinine up to 1.48 this evening. In setting of cardiac catheterization. -Repeat BMP in AM  Hyperlipidemia -Continue Zetia and Lipitor  Ischemic cardiomyopathy Chronic heart failure with reduced EF LVEF of 35%. -Continue metoprolol succinate  DVT prophylaxis: Heparin Code Status:   Code Status: Full Code Family Communication: None at bedside Disposition Plan: Discharge likely in AM pending stable creatinine   Consultants:  Cardiology  Procedures:  Cardiac catheterization Transthoracic Echocardiogram   Antimicrobials: None    Subjective: Patient reports no chest pain at rest. Hoping to get up and move today.  Objective: BP 131/66 (BP Location: Left Arm)   Pulse 72   Temp 98.9 F (37.2 C) (Oral)   Resp 17   Ht 6' (1.829 m)   Wt 75.3 kg   SpO2 95%   BMI 22.51 kg/m   Examination:  General exam: Appears calm and comfortable Respiratory system: Clear to auscultation. Respiratory effort normal. Cardiovascular system: S1 & S2 heard, RRR. No  murmurs. Gastrointestinal system: Abdomen is nondistended, soft and nontender. Normal bowel sounds heard. Central nervous system: Alert and oriented. No focal neurological deficits. Psychiatry: Judgement and insight appear normal. Mood & affect appropriate.    Data Reviewed: I have personally reviewed following labs and imaging studies  CBC Lab Results  Component Value Date   WBC 9.4 09/24/2023   RBC 4.47 09/24/2023   HGB 12.6 (L) 09/24/2023   HCT 37.6 (L) 09/24/2023   MCV 84.1 09/24/2023   MCH 28.2 09/24/2023   PLT 161 09/24/2023   MCHC 33.5 09/24/2023   RDW 15.8 (H) 09/24/2023   LYMPHSABS 2.2 06/28/2015   MONOABS 2.1 (H) 06/28/2015   EOSABS 0.0 06/28/2015   BASOSABS 0.0 06/28/2015     Last metabolic panel Lab Results  Component Value Date   NA 137 09/24/2023   K 4.3 09/24/2023   CL 104 09/24/2023   CO2 23 09/24/2023   BUN 11 09/24/2023   CREATININE 1.48 (H) 09/24/2023   GLUCOSE 109 (H) 09/24/2023   GFRNONAA 53 (L) 09/24/2023   GFRAA >60 07/01/2015   CALCIUM 9.1 09/24/2023   PROT 4.8 (L) 09/23/2023   ALBUMIN 2.8 (L) 09/23/2023   BILITOT 0.1 (L) 09/23/2023   ALKPHOS 55 09/23/2023   AST 13 (L) 09/23/2023   ALT 15 09/23/2023   ANIONGAP 10 09/24/2023    GFR: Estimated Creatinine Clearance: 55.1 mL/min (A) (by C-G formula based on SCr of 1.48 mg/dL (H)).  No results found for this or any previous visit (from the past 240 hour(s)).    Radiology Studies: CARDIAC CATHETERIZATION  Addendum Date: 09/24/2023   Coronary angiography 09/23/2023: LM: Minimal disease LAD: Ostial prox 20-30% disease          Patent mid ALD stent  PROGRESS NOTE    Randy Webb.  KGM:[id number] DOB: 06/22/1961 DOA: 09/22/2023 PCP: Galvin Proffer, MD   Brief Narrative: No notes on file   Assessment and Plan:  Chest pain Present on admission. Concerning in setting of underlying CAD. Troponin elevation with peak of 89. Cardiology consulted and performed cardiac catheterization which was significant for multivessel disease unchanged from previous catheterization. -Cardiology recommendations: Imdur, aspirin, Plavix  Tobacco use Vaping Ongoing. Recommendation for complete cessation on admission.  CAD History of PCI with stent placement. Cardiac catheterization performed this admission significant for multivessel CAD without significant change from previous catheterization from January 2024. Cardiology recommendation to continue DAPT to finish out one year of treatment followed by aspirin monotherapy.  AKI Baseline creatinine of 1-1.1. Creatinine up to 1.48 this evening. In setting of cardiac catheterization. -Repeat BMP in AM  Hyperlipidemia -Continue Zetia and Lipitor  Ischemic cardiomyopathy Chronic heart failure with reduced EF LVEF of 35%. -Continue metoprolol succinate  DVT prophylaxis: Heparin Code Status:   Code Status: Full Code Family Communication: None at bedside Disposition Plan: Discharge likely in AM pending stable creatinine   Consultants:  Cardiology  Procedures:  Cardiac catheterization Transthoracic Echocardiogram   Antimicrobials: None    Subjective: Patient reports no chest pain at rest. Hoping to get up and move today.  Objective: BP 131/66 (BP Location: Left Arm)   Pulse 72   Temp 98.9 F (37.2 C) (Oral)   Resp 17   Ht 6' (1.829 m)   Wt 75.3 kg   SpO2 95%   BMI 22.51 kg/m   Examination:  General exam: Appears calm and comfortable Respiratory system: Clear to auscultation. Respiratory effort normal. Cardiovascular system: S1 & S2 heard, RRR. No  murmurs. Gastrointestinal system: Abdomen is nondistended, soft and nontender. Normal bowel sounds heard. Central nervous system: Alert and oriented. No focal neurological deficits. Psychiatry: Judgement and insight appear normal. Mood & affect appropriate.    Data Reviewed: I have personally reviewed following labs and imaging studies  CBC Lab Results  Component Value Date   WBC 9.4 09/24/2023   RBC 4.47 09/24/2023   HGB 12.6 (L) 09/24/2023   HCT 37.6 (L) 09/24/2023   MCV 84.1 09/24/2023   MCH 28.2 09/24/2023   PLT 161 09/24/2023   MCHC 33.5 09/24/2023   RDW 15.8 (H) 09/24/2023   LYMPHSABS 2.2 06/28/2015   MONOABS 2.1 (H) 06/28/2015   EOSABS 0.0 06/28/2015   BASOSABS 0.0 06/28/2015     Last metabolic panel Lab Results  Component Value Date   NA 137 09/24/2023   K 4.3 09/24/2023   CL 104 09/24/2023   CO2 23 09/24/2023   BUN 11 09/24/2023   CREATININE 1.48 (H) 09/24/2023   GLUCOSE 109 (H) 09/24/2023   GFRNONAA 53 (L) 09/24/2023   GFRAA >60 07/01/2015   CALCIUM 9.1 09/24/2023   PROT 4.8 (L) 09/23/2023   ALBUMIN 2.8 (L) 09/23/2023   BILITOT 0.1 (L) 09/23/2023   ALKPHOS 55 09/23/2023   AST 13 (L) 09/23/2023   ALT 15 09/23/2023   ANIONGAP 10 09/24/2023    GFR: Estimated Creatinine Clearance: 55.1 mL/min (A) (by C-G formula based on SCr of 1.48 mg/dL (H)).  No results found for this or any previous visit (from the past 240 hour(s)).    Radiology Studies: CARDIAC CATHETERIZATION  Addendum Date: 09/24/2023   Coronary angiography 09/23/2023: LM: Minimal disease LAD: Ostial prox 20-30% disease          Patent mid ALD stent  with 20% late lumen loss          Diag 2 with prox 80% diffuse disease Lcx: Prox 30% disease         Mid 20% late lunen loss within prior stent         Small caliber OM2 with diffuse 80% disease RCA: Serial 90% stenoses with distal occlusion           Left-to-right collaterals LVEDP normal Multivessel CAD with no significant change compared to  previous cath in 12/2022 No new culprit stenoses to benefit from revascularization Continue medical management Consider DAPT for 1 year vs Aspirin monotherapy Elder Negus, MD   Result Date: 09/24/2023 Coronary angiography 09/23/2023: LM: Minimal disease LAD: Ostial prox 20-30% disease          Patent mid ALD stent with 20% late lumen loss          Diag 2 with prox 80% diffuse disease Lcx: Prox 30% disease         Mid 20% late lunen loss within prior stent         Small caliber OM2 with diffuse 80% disease RCA: Serial 90% stenoses with distal occlusion           Left-to-right collaterals LVEDP normal Multivessel CAD with no significant change compared to previous cath in 12/2022 No new culprit stenoses to benefit from revascularization Continue medical management Consider DAPT for 1 year vs Aspirin monotherapy Elder Negus, MD   ECHOCARDIOGRAM COMPLETE  Result Date: 09/24/2023    ECHOCARDIOGRAM REPORT   Patient Name:   Randy Webb. Date of Exam: 09/24/2023 Medical Rec #:  [id number]            Height:       72.0 in Accession #:    [id number]           Weight:       166.0 lb Date of Birth:  1961/02/11           BSA:          1.968 m Patient Age:    61 years             BP:           155/76 mmHg Patient Gender: M                    HR:           73 bpm. Exam Location:  Inpatient Procedure: 2D Echo, Color Doppler and Cardiac Doppler Indications:    Elevated Troponins,; R07.9* Chest pain, unspecified  History:        Patient has prior history of Echocardiogram examinations, most                 recent 03/13/2023. CAD, COPD; Risk Factors:Hypertension,                 Dyslipidemia, Diabetes and Current Smoker.  Sonographer:    Irving Burton Senior RDCS Referring Phys: Darlin Drop  Sonographer Comments: Technically difficult windows due to lung interference. IMPRESSIONS  1. Left ventricular ejection fraction, by estimation, is 35%. Left ventricular ejection fraction by 2D MOD biplane is 36.4 %. The  left ventricle has moderately decreased function. The left ventricle demonstrates global hypokinesis with mild hypokinesis of the anterior wall in limited views. Left ventricular diastolic parameters are consistent with Grade III diastolic dysfunction (restrictive).  2. Right ventricular systolic function is normal. The right ventricular size  VAS US CAROTID  Result Date: 09/23/2023 Carotid Arterial Duplex Study Patient Name:  Randy Webb.  Date of Exam:   09/23/2023 Medical Rec #: [id number]             Accession #:    [id number] Date of Birth: February 06, 1961            Patient Gender: M Patient Age:   60 years Exam Location:  [facility] Procedure:      VAS US CAROTID Referring Phys: Yates Decamp --------------------------------------------------------------------------------  Indications:       Bilateral bruits. Risk Factors:      Hypertension, hyperlipidemia, Diabetes, current smoker. Comparison Study:  No prior studies. Performing Technologist: Chanda Busing RVT  Examination Guidelines: A complete evaluation includes B-mode imaging, spectral Doppler, color Doppler, and power Doppler as needed of all accessible portions of each vessel. Bilateral testing is considered an integral part of a complete examination. Limited examinations for reoccurring indications may be performed as noted.  Right Carotid Findings:  +----------+--------+--------+--------+--------------------------+--------+           PSV cm/sEDV cm/sStenosisPlaque Description        Comments +----------+--------+--------+--------+--------------------------+--------+ CCA Prox  90      14              smooth and heterogenous            +----------+--------+--------+--------+--------------------------+--------+ CCA Distal71      20              smooth and heterogenous            +----------+--------+--------+--------+--------------------------+--------+ ICA Prox  260     66      60-79%  irregular and heterogenous         +----------+--------+--------+--------+--------------------------+--------+ ICA Mid   172     48      1-39%                             tortuous +----------+--------+--------+--------+--------------------------+--------+ ICA Distal106     34                                        tortuous +----------+--------+--------+--------+--------------------------+--------+ ECA       193     19                                                 +----------+--------+--------+--------+--------------------------+--------+ +----------+--------+-------+--------+-------------------+           PSV cm/sEDV cmsDescribeArm Pressure (mmHG) +----------+--------+-------+--------+-------------------+ Subclavian115                                        +----------+--------+-------+--------+-------------------+ +---------+--------+--+--------+-+---------+ VertebralPSV cm/s44EDV cm/s8Antegrade +---------+--------+--+--------+-+---------+  Left Carotid Findings: +----------+--------+--------+--------+-----------------------+--------+           PSV cm/sEDV cm/sStenosisPlaque Description     Comments +----------+--------+--------+--------+-----------------------+--------+ CCA Prox  87      18              smooth and heterogenous          +----------+--------+--------+--------+-----------------------+--------+ CCA Distal87      20  PROGRESS NOTE    Randy Webb.  KGM:[id number] DOB: 06/22/1961 DOA: 09/22/2023 PCP: Galvin Proffer, MD   Brief Narrative: No notes on file   Assessment and Plan:  Chest pain Present on admission. Concerning in setting of underlying CAD. Troponin elevation with peak of 89. Cardiology consulted and performed cardiac catheterization which was significant for multivessel disease unchanged from previous catheterization. -Cardiology recommendations: Imdur, aspirin, Plavix  Tobacco use Vaping Ongoing. Recommendation for complete cessation on admission.  CAD History of PCI with stent placement. Cardiac catheterization performed this admission significant for multivessel CAD without significant change from previous catheterization from January 2024. Cardiology recommendation to continue DAPT to finish out one year of treatment followed by aspirin monotherapy.  AKI Baseline creatinine of 1-1.1. Creatinine up to 1.48 this evening. In setting of cardiac catheterization. -Repeat BMP in AM  Hyperlipidemia -Continue Zetia and Lipitor  Ischemic cardiomyopathy Chronic heart failure with reduced EF LVEF of 35%. -Continue metoprolol succinate  DVT prophylaxis: Heparin Code Status:   Code Status: Full Code Family Communication: None at bedside Disposition Plan: Discharge likely in AM pending stable creatinine   Consultants:  Cardiology  Procedures:  Cardiac catheterization Transthoracic Echocardiogram   Antimicrobials: None    Subjective: Patient reports no chest pain at rest. Hoping to get up and move today.  Objective: BP 131/66 (BP Location: Left Arm)   Pulse 72   Temp 98.9 F (37.2 C) (Oral)   Resp 17   Ht 6' (1.829 m)   Wt 75.3 kg   SpO2 95%   BMI 22.51 kg/m   Examination:  General exam: Appears calm and comfortable Respiratory system: Clear to auscultation. Respiratory effort normal. Cardiovascular system: S1 & S2 heard, RRR. No  murmurs. Gastrointestinal system: Abdomen is nondistended, soft and nontender. Normal bowel sounds heard. Central nervous system: Alert and oriented. No focal neurological deficits. Psychiatry: Judgement and insight appear normal. Mood & affect appropriate.    Data Reviewed: I have personally reviewed following labs and imaging studies  CBC Lab Results  Component Value Date   WBC 9.4 09/24/2023   RBC 4.47 09/24/2023   HGB 12.6 (L) 09/24/2023   HCT 37.6 (L) 09/24/2023   MCV 84.1 09/24/2023   MCH 28.2 09/24/2023   PLT 161 09/24/2023   MCHC 33.5 09/24/2023   RDW 15.8 (H) 09/24/2023   LYMPHSABS 2.2 06/28/2015   MONOABS 2.1 (H) 06/28/2015   EOSABS 0.0 06/28/2015   BASOSABS 0.0 06/28/2015     Last metabolic panel Lab Results  Component Value Date   NA 137 09/24/2023   K 4.3 09/24/2023   CL 104 09/24/2023   CO2 23 09/24/2023   BUN 11 09/24/2023   CREATININE 1.48 (H) 09/24/2023   GLUCOSE 109 (H) 09/24/2023   GFRNONAA 53 (L) 09/24/2023   GFRAA >60 07/01/2015   CALCIUM 9.1 09/24/2023   PROT 4.8 (L) 09/23/2023   ALBUMIN 2.8 (L) 09/23/2023   BILITOT 0.1 (L) 09/23/2023   ALKPHOS 55 09/23/2023   AST 13 (L) 09/23/2023   ALT 15 09/23/2023   ANIONGAP 10 09/24/2023    GFR: Estimated Creatinine Clearance: 55.1 mL/min (A) (by C-G formula based on SCr of 1.48 mg/dL (H)).  No results found for this or any previous visit (from the past 240 hour(s)).    Radiology Studies: CARDIAC CATHETERIZATION  Addendum Date: 09/24/2023   Coronary angiography 09/23/2023: LM: Minimal disease LAD: Ostial prox 20-30% disease          Patent mid ALD stent

## 2023-09-24 NOTE — Progress Notes (Signed)
CSW spoke with patient at bedside. Patient reports he is from home alone. CSW offered patient [employer] guide resources and [facility] transportation resources. Patient accepted both resources. All questions answered. Patient reports his Aunt or nephew will be able to pick him up when medically ready for dc. No further questions reported at this time. SDOH screening complete.

## 2023-09-24 NOTE — Progress Notes (Signed)
Echocardiogram 2D Echocardiogram has been performed.  Warren Lacy Darrielle Pflieger RDCS 09/24/2023, 12:20 PM

## 2023-09-25 DIAGNOSIS — N1831 Chronic kidney disease, stage 3a: Secondary | ICD-10-CM

## 2023-09-25 DIAGNOSIS — R079 Chest pain, unspecified: Secondary | ICD-10-CM | POA: Diagnosis not present

## 2023-09-25 DIAGNOSIS — Z720 Tobacco use: Secondary | ICD-10-CM | POA: Diagnosis not present

## 2023-09-25 DIAGNOSIS — I25110 Atherosclerotic heart disease of native coronary artery with unstable angina pectoris: Secondary | ICD-10-CM | POA: Diagnosis not present

## 2023-09-25 LAB — MAGNESIUM: Magnesium: 2.1 mg/dL (ref 1.7–2.4)

## 2023-09-25 LAB — BASIC METABOLIC PANEL
Anion gap: 10 (ref 5–15)
BUN: 12 mg/dL (ref 8–23)
CO2: 26 mmol/L (ref 22–32)
Calcium: 9.4 mg/dL (ref 8.9–10.3)
Chloride: 104 mmol/L (ref 98–111)
Creatinine, Ser: 1.48 mg/dL — ABNORMAL HIGH (ref 0.61–1.24)
GFR, Estimated: 53 mL/min — ABNORMAL LOW (ref >60)
Glucose, Bld: 103 mg/dL — ABNORMAL HIGH (ref 70–99)
Potassium: 4.5 mmol/L (ref 3.5–5.1)
Sodium: 140 mmol/L (ref 135–145)

## 2023-09-25 MED ORDER — LOSARTAN POTASSIUM 25 MG PO TABS
25.0000 mg | ORAL_TABLET | Freq: Every day | ORAL | 2 refills | Status: DC
Start: 2023-09-25 — End: 2023-09-25

## 2023-09-25 MED ORDER — METOPROLOL SUCCINATE ER 25 MG PO TB24: 25.0000 mg | ORAL_TABLET | Freq: Every day | ORAL | 2 refills | Status: DC

## 2023-09-25 MED ORDER — POTASSIUM CHLORIDE ER 10 MEQ PO CPCR: 20.0000 meq | ORAL_CAPSULE | Freq: Every day | ORAL | 0 refills | Status: DC

## 2023-09-25 MED ORDER — ISOSORBIDE MONONITRATE ER 30 MG PO TB24: 30.0000 mg | ORAL_TABLET | Freq: Every day | ORAL | 2 refills | Status: DC

## 2023-09-25 MED ORDER — CLOPIDOGREL BISULFATE 75 MG PO TABS: 75.0000 mg | ORAL_TABLET | Freq: Every day | ORAL | 0 refills | Status: AC

## 2023-09-25 MED ORDER — MAGNESIUM OXIDE -MG SUPPLEMENT 400 (240 MG) MG PO TABS: 400.0000 mg | ORAL_TABLET | Freq: Every day | ORAL | 0 refills | Status: AC

## 2023-09-25 NOTE — Discharge Instructions (Addendum)
Randy Webb.,  You were here with chest pain. Thankfully, your heart disease is no worse than it was at the beginning of the year. The cardiologist has adjusted your medications to hopefully reduce how much chest pain you have. Please follow-up with your PCP and cardiologist.    Information about your medication: Plavix (anti-platelet agent)  Generic Name (Brand): clopidogrel (Plavix), once daily medication  PURPOSE: You are taking this medication along with aspirin to lower your chance of having a heart attack, stroke, or blood clots in your heart stent. These can be fatal. Plavix and aspirin help prevent platelets from sticking together and forming a clot that can block an artery or your stent.   Common SIDE EFFECTS you may experience include: bruising or bleeding more easily, shortness of breath  Do not stop taking PLAVIX without talking to the doctor who prescribes it for you. People who are treated with a stent and stop taking Plavix too soon, have a higher risk of getting a blood clot in the stent, having a heart attack, or dying. If you stop Plavix because of bleeding, or for other reasons, your risk of a heart attack or stroke may increase.   Avoid taking NSAID agents or anti-inflammatory medications such as ibuprofen, naproxen given increased bleed risk with plavix - can use acetaminophen (Tylenol) if needed for pain.  Avoid taking over the counter stomach medications omeprazole (Prilosec) or esomeprazole (Nexium) since these do interact and make plavix less effective - ask your pharmacist or doctor for alterative agents if needed for heartburn or GERD.   Tell all of your doctors and dentists that you are taking Plavix. They should talk to the doctor who prescribed Plavix for you before you have any surgery or invasive procedure.   Contact your health care provider if you experience: severe or uncontrollable bleeding, pink/red/brown urine, vomiting blood or vomit that looks  like "coffee grounds", red or black stools (looks like tar), coughing up blood or blood clots ----------------------------------------------------------------------------------------------------------------------

## 2023-09-25 NOTE — Discharge Summary (Addendum)
30% disease         Mid 20% late lunen loss within prior stent         Small caliber OM2 with diffuse 80% disease RCA: Serial 90% stenoses with distal occlusion           Left-to-right collaterals LVEDP normal Multivessel CAD with no significant change compared to previous cath in 12/2022 No new culprit stenoses to benefit from revascularization Continue medical management Consider DAPT for 1 year vs Aspirin monotherapy Elder Negus, MD   Result Date: 09/24/2023 Coronary angiography 09/23/2023: LM: Minimal disease LAD: Ostial prox 20-30% disease          Patent mid ALD stent with 20% late lumen loss          Diag 2 with prox 80% diffuse disease Lcx: Prox 30% disease         Mid 20% late lunen loss within prior  stent         Small caliber OM2 with diffuse 80% disease RCA: Serial 90% stenoses with distal occlusion           Left-to-right collaterals LVEDP normal Multivessel CAD with no significant change compared to previous cath in 12/2022 No new culprit stenoses to benefit from revascularization Continue medical management Consider DAPT for 1 year vs Aspirin monotherapy Elder Negus, MD   ECHOCARDIOGRAM COMPLETE  Result Date: 09/24/2023    ECHOCARDIOGRAM REPORT   Patient Name:   Randy Webb. Date of Exam: 09/24/2023 Medical Rec #:  [id number]            Height:       72.0 in Accession #:    [id number]           Weight:       166.0 lb Date of Birth:  1960-12-18           BSA:          1.968 m Patient Age:    61 years             BP:           155/76 mmHg Patient Gender: M                    HR:           73 bpm. Exam Location:  Inpatient Procedure: 2D Echo, Color Doppler and Cardiac Doppler Indications:    Elevated Troponins,; R07.9* Chest pain, unspecified  History:        Patient has prior history of Echocardiogram examinations, most                 recent 03/13/2023. CAD, COPD; Risk Factors:Hypertension,                 Dyslipidemia, Diabetes and Current Smoker.  Sonographer:    Irving Burton Senior RDCS Referring Phys: Darlin Drop  Sonographer Comments: Technically difficult windows due to lung interference. IMPRESSIONS  1. Left ventricular ejection fraction, by estimation, is 35%. Left ventricular ejection fraction by 2D MOD biplane is 36.4 %. The left ventricle has moderately decreased function. The left ventricle demonstrates global hypokinesis with mild hypokinesis of the anterior wall in limited views. Left ventricular diastolic parameters are consistent with Grade III diastolic dysfunction (restrictive).  2. Right ventricular systolic function is normal. The right ventricular size is normal. Tricuspid regurgitation signal is inadequate for assessing PA pressure.  3. The mitral valve is degenerative.  Mild mitral valve regurgitation.  Physician Discharge Summary   Patient: Randy Webb. MRN: [id number] DOB: 1961-08-21  Admit date:     09/22/2023  Discharge date: 09/25/23  Discharge Physician: Jacquelin Hawking, MD   PCP: Galvin Proffer, MD   Recommendations at discharge:  PCP and cardiology follow-up Repeat BMP in 3-5 days  Discharge Diagnoses: Principal Problem:   Chest pain Active Problems:   Tobacco use   Coronary artery disease involving native coronary artery of native heart with unstable angina pectoris (HCC)   Bilateral carotid bruits   Asymptomatic stenosis of right carotid artery  Resolved Problems:   * No resolved hospital problems. *  Hospital Course: Randy Sobalvarro. is a 62 y.o. male with a history of nocturnal hypoxia on 2 L, CAD status post PCI with DES in January 2024 on dual antiplatelet therapy, COPD, tobacco use, hyperlipidemia, ischemic cardiomyopathy with LVEF of 35%, hypothyroidism, diabetes mellitus type 2, GERD.  Patient presented secondary to chest pain.  Cardiology consulted and performed left heart catheterization which showed evidence of stable coronary artery disease.  Patient started on Imdur for symptom management.  Patient developed a mild AKI post cardiac catheterization with stabilization of creatinine prior to discharge.  Assessment and Plan:  Chest pain Present on admission. Concerning in setting of underlying CAD. Troponin elevation with peak of 89. Cardiology consulted and performed cardiac catheterization which was significant for multivessel disease unchanged from previous catheterization. Discharge on Imdur, aspirin and Plavix.   Tobacco use Vaping Ongoing. Recommendation for complete cessation on admission.   CAD History of PCI with stent placement. Cardiac catheterization performed this admission significant for multivessel CAD without significant change from previous catheterization from January 2024. Cardiology recommendation to continue DAPT to finish out  one year of treatment followed by aspirin monotherapy.   AKI Baseline creatinine of 1-1.1. Creatinine up to 1.48 and stable. In setting of cardiac catheterization. Will likely recover over time. Recommend repeat labs in 3-5 days.   Hyperlipidemia Continue Zetia and Lipitor   Ischemic cardiomyopathy Chronic heart failure with reduced EF LVEF of 35%. After discussion with cardiology, Entresto, Lasix and spironolactone discontinued. Patient transitioned to losartan. Continue metoprolol succinate.   Consultants:  Cardiology   Procedures:  Cardiac catheterization Transthoracic Echocardiogram   Disposition: Home Diet recommendation: Cardiac diet   DISCHARGE MEDICATION: Allergies as of 09/25/2023       Reactions   Aspirin Other (See Comments)   Nose bleeds        Medication List     STOP taking these medications    cloNIDine 0.1 MG tablet Commonly known as: CATAPRES   dapagliflozin propanediol 10 MG Tabs tablet Commonly known as: FARXIGA   Entresto 24-26 MG Generic drug: sacubitril-valsartan   furosemide 40 MG tablet Commonly known as: LASIX   isosorbide-hydrALAZINE 20-37.5 MG tablet Commonly known as: BIDIL   metoprolol tartrate 25 MG tablet Commonly known as: LOPRESSOR   niacin 500 MG ER tablet Commonly known as: VITAMIN B3   omeprazole 40 MG capsule Commonly known as: PRILOSEC   potassium chloride 10 MEQ CR capsule Commonly known as: MICRO-K   ranolazine 500 MG 12 hr tablet Commonly known as: RANEXA   spironolactone 25 MG tablet Commonly known as: ALDACTONE       TAKE these medications    albuterol (2.5 MG/3ML) 0.083% nebulizer solution Commonly known as: PROVENTIL Take 3 mLs (2.5 mg total) by nebulization every 4 (four) hours as needed for wheezing or shortness of breath.   aspirin 81 MG  Accession #:    [id number] Date of Birth: November 22, 1961            Patient Gender: M Patient Age:   72 years Exam Location:  [facility] Procedure:      VAS US CAROTID Referring Phys: Yates Decamp --------------------------------------------------------------------------------  Indications:       Bilateral bruits. Risk Factors:      Hypertension, hyperlipidemia, Diabetes, current smoker. Comparison Study:  No prior studies. Performing Technologist: Chanda Busing RVT  Examination Guidelines: A complete evaluation includes B-mode imaging, spectral Doppler, color Doppler, and power Doppler as needed of all accessible portions of each vessel. Bilateral testing is considered an integral part of a complete examination. Limited examinations for reoccurring indications may be performed as noted.  Right Carotid Findings: +----------+--------+--------+--------+--------------------------+--------+           PSV cm/sEDV cm/sStenosisPlaque Description        Comments +----------+--------+--------+--------+--------------------------+--------+ CCA Prox  90      14              smooth and heterogenous            +----------+--------+--------+--------+--------------------------+--------+ CCA Distal71      20              smooth and heterogenous             +----------+--------+--------+--------+--------------------------+--------+ ICA Prox  260     66      60-79%  irregular and heterogenous         +----------+--------+--------+--------+--------------------------+--------+ ICA Mid   172     48      1-39%                             tortuous +----------+--------+--------+--------+--------------------------+--------+ ICA Distal106     34                                        tortuous +----------+--------+--------+--------+--------------------------+--------+ ECA       193     19                                                 +----------+--------+--------+--------+--------------------------+--------+ +----------+--------+-------+--------+-------------------+           PSV cm/sEDV cmsDescribeArm Pressure (mmHG) +----------+--------+-------+--------+-------------------+ Subclavian115                                        +----------+--------+-------+--------+-------------------+ +---------+--------+--+--------+-+---------+ VertebralPSV cm/s44EDV cm/s8Antegrade +---------+--------+--+--------+-+---------+  Left Carotid Findings: +----------+--------+--------+--------+-----------------------+--------+           PSV cm/sEDV cm/sStenosisPlaque Description     Comments +----------+--------+--------+--------+-----------------------+--------+ CCA Prox  87      18              smooth and heterogenous         +----------+--------+--------+--------+-----------------------+--------+ CCA Distal87      20              smooth and heterogenous         +----------+--------+--------+--------+-----------------------+--------+ ICA Prox  91      20              smooth and heterogenous         +----------+--------+--------+--------+-----------------------+--------+  Physician Discharge Summary   Patient: Randy Webb. MRN: [id number] DOB: 1961-08-21  Admit date:     09/22/2023  Discharge date: 09/25/23  Discharge Physician: Jacquelin Hawking, MD   PCP: Galvin Proffer, MD   Recommendations at discharge:  PCP and cardiology follow-up Repeat BMP in 3-5 days  Discharge Diagnoses: Principal Problem:   Chest pain Active Problems:   Tobacco use   Coronary artery disease involving native coronary artery of native heart with unstable angina pectoris (HCC)   Bilateral carotid bruits   Asymptomatic stenosis of right carotid artery  Resolved Problems:   * No resolved hospital problems. *  Hospital Course: Randy Sobalvarro. is a 62 y.o. male with a history of nocturnal hypoxia on 2 L, CAD status post PCI with DES in January 2024 on dual antiplatelet therapy, COPD, tobacco use, hyperlipidemia, ischemic cardiomyopathy with LVEF of 35%, hypothyroidism, diabetes mellitus type 2, GERD.  Patient presented secondary to chest pain.  Cardiology consulted and performed left heart catheterization which showed evidence of stable coronary artery disease.  Patient started on Imdur for symptom management.  Patient developed a mild AKI post cardiac catheterization with stabilization of creatinine prior to discharge.  Assessment and Plan:  Chest pain Present on admission. Concerning in setting of underlying CAD. Troponin elevation with peak of 89. Cardiology consulted and performed cardiac catheterization which was significant for multivessel disease unchanged from previous catheterization. Discharge on Imdur, aspirin and Plavix.   Tobacco use Vaping Ongoing. Recommendation for complete cessation on admission.   CAD History of PCI with stent placement. Cardiac catheterization performed this admission significant for multivessel CAD without significant change from previous catheterization from January 2024. Cardiology recommendation to continue DAPT to finish out  one year of treatment followed by aspirin monotherapy.   AKI Baseline creatinine of 1-1.1. Creatinine up to 1.48 and stable. In setting of cardiac catheterization. Will likely recover over time. Recommend repeat labs in 3-5 days.   Hyperlipidemia Continue Zetia and Lipitor   Ischemic cardiomyopathy Chronic heart failure with reduced EF LVEF of 35%. After discussion with cardiology, Entresto, Lasix and spironolactone discontinued. Patient transitioned to losartan. Continue metoprolol succinate.   Consultants:  Cardiology   Procedures:  Cardiac catheterization Transthoracic Echocardiogram   Disposition: Home Diet recommendation: Cardiac diet   DISCHARGE MEDICATION: Allergies as of 09/25/2023       Reactions   Aspirin Other (See Comments)   Nose bleeds        Medication List     STOP taking these medications    cloNIDine 0.1 MG tablet Commonly known as: CATAPRES   dapagliflozin propanediol 10 MG Tabs tablet Commonly known as: FARXIGA   Entresto 24-26 MG Generic drug: sacubitril-valsartan   furosemide 40 MG tablet Commonly known as: LASIX   isosorbide-hydrALAZINE 20-37.5 MG tablet Commonly known as: BIDIL   metoprolol tartrate 25 MG tablet Commonly known as: LOPRESSOR   niacin 500 MG ER tablet Commonly known as: VITAMIN B3   omeprazole 40 MG capsule Commonly known as: PRILOSEC   potassium chloride 10 MEQ CR capsule Commonly known as: MICRO-K   ranolazine 500 MG 12 hr tablet Commonly known as: RANEXA   spironolactone 25 MG tablet Commonly known as: ALDACTONE       TAKE these medications    albuterol (2.5 MG/3ML) 0.083% nebulizer solution Commonly known as: PROVENTIL Take 3 mLs (2.5 mg total) by nebulization every 4 (four) hours as needed for wheezing or shortness of breath.   aspirin 81 MG  Accession #:    [id number] Date of Birth: November 22, 1961            Patient Gender: M Patient Age:   72 years Exam Location:  [facility] Procedure:      VAS US CAROTID Referring Phys: Yates Decamp --------------------------------------------------------------------------------  Indications:       Bilateral bruits. Risk Factors:      Hypertension, hyperlipidemia, Diabetes, current smoker. Comparison Study:  No prior studies. Performing Technologist: Chanda Busing RVT  Examination Guidelines: A complete evaluation includes B-mode imaging, spectral Doppler, color Doppler, and power Doppler as needed of all accessible portions of each vessel. Bilateral testing is considered an integral part of a complete examination. Limited examinations for reoccurring indications may be performed as noted.  Right Carotid Findings: +----------+--------+--------+--------+--------------------------+--------+           PSV cm/sEDV cm/sStenosisPlaque Description        Comments +----------+--------+--------+--------+--------------------------+--------+ CCA Prox  90      14              smooth and heterogenous            +----------+--------+--------+--------+--------------------------+--------+ CCA Distal71      20              smooth and heterogenous             +----------+--------+--------+--------+--------------------------+--------+ ICA Prox  260     66      60-79%  irregular and heterogenous         +----------+--------+--------+--------+--------------------------+--------+ ICA Mid   172     48      1-39%                             tortuous +----------+--------+--------+--------+--------------------------+--------+ ICA Distal106     34                                        tortuous +----------+--------+--------+--------+--------------------------+--------+ ECA       193     19                                                 +----------+--------+--------+--------+--------------------------+--------+ +----------+--------+-------+--------+-------------------+           PSV cm/sEDV cmsDescribeArm Pressure (mmHG) +----------+--------+-------+--------+-------------------+ Subclavian115                                        +----------+--------+-------+--------+-------------------+ +---------+--------+--+--------+-+---------+ VertebralPSV cm/s44EDV cm/s8Antegrade +---------+--------+--+--------+-+---------+  Left Carotid Findings: +----------+--------+--------+--------+-----------------------+--------+           PSV cm/sEDV cm/sStenosisPlaque Description     Comments +----------+--------+--------+--------+-----------------------+--------+ CCA Prox  87      18              smooth and heterogenous         +----------+--------+--------+--------+-----------------------+--------+ CCA Distal87      20              smooth and heterogenous         +----------+--------+--------+--------+-----------------------+--------+ ICA Prox  91      20              smooth and heterogenous         +----------+--------+--------+--------+-----------------------+--------+  30% disease         Mid 20% late lunen loss within prior stent         Small caliber OM2 with diffuse 80% disease RCA: Serial 90% stenoses with distal occlusion           Left-to-right collaterals LVEDP normal Multivessel CAD with no significant change compared to previous cath in 12/2022 No new culprit stenoses to benefit from revascularization Continue medical management Consider DAPT for 1 year vs Aspirin monotherapy Elder Negus, MD   Result Date: 09/24/2023 Coronary angiography 09/23/2023: LM: Minimal disease LAD: Ostial prox 20-30% disease          Patent mid ALD stent with 20% late lumen loss          Diag 2 with prox 80% diffuse disease Lcx: Prox 30% disease         Mid 20% late lunen loss within prior  stent         Small caliber OM2 with diffuse 80% disease RCA: Serial 90% stenoses with distal occlusion           Left-to-right collaterals LVEDP normal Multivessel CAD with no significant change compared to previous cath in 12/2022 No new culprit stenoses to benefit from revascularization Continue medical management Consider DAPT for 1 year vs Aspirin monotherapy Elder Negus, MD   ECHOCARDIOGRAM COMPLETE  Result Date: 09/24/2023    ECHOCARDIOGRAM REPORT   Patient Name:   Randy Webb. Date of Exam: 09/24/2023 Medical Rec #:  [id number]            Height:       72.0 in Accession #:    [id number]           Weight:       166.0 lb Date of Birth:  1960-12-18           BSA:          1.968 m Patient Age:    61 years             BP:           155/76 mmHg Patient Gender: M                    HR:           73 bpm. Exam Location:  Inpatient Procedure: 2D Echo, Color Doppler and Cardiac Doppler Indications:    Elevated Troponins,; R07.9* Chest pain, unspecified  History:        Patient has prior history of Echocardiogram examinations, most                 recent 03/13/2023. CAD, COPD; Risk Factors:Hypertension,                 Dyslipidemia, Diabetes and Current Smoker.  Sonographer:    Irving Burton Senior RDCS Referring Phys: Darlin Drop  Sonographer Comments: Technically difficult windows due to lung interference. IMPRESSIONS  1. Left ventricular ejection fraction, by estimation, is 35%. Left ventricular ejection fraction by 2D MOD biplane is 36.4 %. The left ventricle has moderately decreased function. The left ventricle demonstrates global hypokinesis with mild hypokinesis of the anterior wall in limited views. Left ventricular diastolic parameters are consistent with Grade III diastolic dysfunction (restrictive).  2. Right ventricular systolic function is normal. The right ventricular size is normal. Tricuspid regurgitation signal is inadequate for assessing PA pressure.  3. The mitral valve is degenerative.  Mild mitral valve regurgitation.  Accession #:    [id number] Date of Birth: November 22, 1961            Patient Gender: M Patient Age:   72 years Exam Location:  [facility] Procedure:      VAS US CAROTID Referring Phys: Yates Decamp --------------------------------------------------------------------------------  Indications:       Bilateral bruits. Risk Factors:      Hypertension, hyperlipidemia, Diabetes, current smoker. Comparison Study:  No prior studies. Performing Technologist: Chanda Busing RVT  Examination Guidelines: A complete evaluation includes B-mode imaging, spectral Doppler, color Doppler, and power Doppler as needed of all accessible portions of each vessel. Bilateral testing is considered an integral part of a complete examination. Limited examinations for reoccurring indications may be performed as noted.  Right Carotid Findings: +----------+--------+--------+--------+--------------------------+--------+           PSV cm/sEDV cm/sStenosisPlaque Description        Comments +----------+--------+--------+--------+--------------------------+--------+ CCA Prox  90      14              smooth and heterogenous            +----------+--------+--------+--------+--------------------------+--------+ CCA Distal71      20              smooth and heterogenous             +----------+--------+--------+--------+--------------------------+--------+ ICA Prox  260     66      60-79%  irregular and heterogenous         +----------+--------+--------+--------+--------------------------+--------+ ICA Mid   172     48      1-39%                             tortuous +----------+--------+--------+--------+--------------------------+--------+ ICA Distal106     34                                        tortuous +----------+--------+--------+--------+--------------------------+--------+ ECA       193     19                                                 +----------+--------+--------+--------+--------------------------+--------+ +----------+--------+-------+--------+-------------------+           PSV cm/sEDV cmsDescribeArm Pressure (mmHG) +----------+--------+-------+--------+-------------------+ Subclavian115                                        +----------+--------+-------+--------+-------------------+ +---------+--------+--+--------+-+---------+ VertebralPSV cm/s44EDV cm/s8Antegrade +---------+--------+--+--------+-+---------+  Left Carotid Findings: +----------+--------+--------+--------+-----------------------+--------+           PSV cm/sEDV cm/sStenosisPlaque Description     Comments +----------+--------+--------+--------+-----------------------+--------+ CCA Prox  87      18              smooth and heterogenous         +----------+--------+--------+--------+-----------------------+--------+ CCA Distal87      20              smooth and heterogenous         +----------+--------+--------+--------+-----------------------+--------+ ICA Prox  91      20              smooth and heterogenous         +----------+--------+--------+--------+-----------------------+--------+  Accession #:    [id number] Date of Birth: November 22, 1961            Patient Gender: M Patient Age:   72 years Exam Location:  [facility] Procedure:      VAS US CAROTID Referring Phys: Yates Decamp --------------------------------------------------------------------------------  Indications:       Bilateral bruits. Risk Factors:      Hypertension, hyperlipidemia, Diabetes, current smoker. Comparison Study:  No prior studies. Performing Technologist: Chanda Busing RVT  Examination Guidelines: A complete evaluation includes B-mode imaging, spectral Doppler, color Doppler, and power Doppler as needed of all accessible portions of each vessel. Bilateral testing is considered an integral part of a complete examination. Limited examinations for reoccurring indications may be performed as noted.  Right Carotid Findings: +----------+--------+--------+--------+--------------------------+--------+           PSV cm/sEDV cm/sStenosisPlaque Description        Comments +----------+--------+--------+--------+--------------------------+--------+ CCA Prox  90      14              smooth and heterogenous            +----------+--------+--------+--------+--------------------------+--------+ CCA Distal71      20              smooth and heterogenous             +----------+--------+--------+--------+--------------------------+--------+ ICA Prox  260     66      60-79%  irregular and heterogenous         +----------+--------+--------+--------+--------------------------+--------+ ICA Mid   172     48      1-39%                             tortuous +----------+--------+--------+--------+--------------------------+--------+ ICA Distal106     34                                        tortuous +----------+--------+--------+--------+--------------------------+--------+ ECA       193     19                                                 +----------+--------+--------+--------+--------------------------+--------+ +----------+--------+-------+--------+-------------------+           PSV cm/sEDV cmsDescribeArm Pressure (mmHG) +----------+--------+-------+--------+-------------------+ Subclavian115                                        +----------+--------+-------+--------+-------------------+ +---------+--------+--+--------+-+---------+ VertebralPSV cm/s44EDV cm/s8Antegrade +---------+--------+--+--------+-+---------+  Left Carotid Findings: +----------+--------+--------+--------+-----------------------+--------+           PSV cm/sEDV cm/sStenosisPlaque Description     Comments +----------+--------+--------+--------+-----------------------+--------+ CCA Prox  87      18              smooth and heterogenous         +----------+--------+--------+--------+-----------------------+--------+ CCA Distal87      20              smooth and heterogenous         +----------+--------+--------+--------+-----------------------+--------+ ICA Prox  91      20              smooth and heterogenous         +----------+--------+--------+--------+-----------------------+--------+

## 2023-09-25 NOTE — Progress Notes (Signed)
Heart Failure Navigator Progress Note  Assessed for Heart & Vascular TOC clinic readiness.  Patient has a [facility] appointment scheduled for 10/02/2023. .   Navigator available for reassessment of patient.   Rhae Hammock, BSN, Scientist, clinical (histocompatibility and immunogenetics) Only

## 2023-09-25 NOTE — Progress Notes (Signed)
Patient Name: Randy Webb. Date of Encounter: 09/25/2023 [facility] Cardiologist: Gypsy Balsam, MD   Interval Summary  .    Feeling well this morning, no chest pain.   Vital Signs .    Vitals:   09/24/23 1253 09/24/23 1631 09/24/23 2101 09/25/23 0628  BP: (!) 143/78 131/66 (!) 136/46 (!) 111/57  Pulse: 85 72 75 64  Resp: 17 17 20 19   Temp: 98.8 F (37.1 C) 98.9 F (37.2 C) 98.7 F (37.1 C) 97.7 F (36.5 C)  TempSrc: Oral Oral Oral Oral  SpO2:  95% 95% 95%  Weight:    76.3 kg  Height:        Intake/Output Summary (Last 24 hours) at 09/25/2023 0812 Last data filed at 09/25/2023 0631 Gross per 24 hour  Intake --  Output 1325 ml  Net -1325 ml      09/25/2023    6:28 AM 09/24/2023    3:14 AM 09/22/2023    9:48 PM  Last 3 Weights  Weight (lbs) 168 lb 3.2 oz 166 lb 166 lb  Weight (kg) 76.295 kg 75.297 kg 75.297 kg      Telemetry/ECG    Sinus Rhythm, 4 beat NSVT - Personally Reviewed  Physical Exam .   GEN: No acute distress.   Neck: No JVD Cardiac: RRR, no murmurs, rubs, or gallops.  Respiratory: Clear to auscultation bilaterally. GI: Soft, nontender, non-distended  MS: No edema  Assessment & Plan .     62 y.o. male with a hx of CAD s/p LCx PCI (2007) and mid LAD PCI (12/2022) with CTO proximal RCA, T2DM, active smoker, HLD who was seen 09/22/2023 for the evaluation of chest pain at the request of [facility].    NSTEMI CAD s/p PCI of LCx/LAD and CTO of RCA -- presented with chest pain that was intermittent for the past 2 days, then developed worsening episode while walking to the mailbox yesterday. Improved with rest, but then returned with activity. Reported compliance with medications, but appeared he has been out of his plavix for at least several days. Unclear what he was actually taking PTA.  -- hsTn 56>>73>>87>>89, EKG without ischemic changes -- Cardiac catheterization 10/22 with multivessel CAD, no significant change compared  to previous cath 12/2022 with no new culprit lesion.  Recommendations to continue DAPT for 1 year versus aspirin monotherapy.  Will continue aspirin/Plavix as he is still within the 1 year window of his last intervention 12/2022.  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  Would plan to reevaluate renal function in the outpatient setting and add if able.    HLD -- LDL 173, HDL 30 -- Has been resumed on atorvastatin 80 mg daily --Will need LFT/FLP in 8 weeks  AKI -- Cr 0.96>>1.35>>1.48 post cath -- holding on addition of ARB at this time, encouraged PO fluids    PreDM -- Hemoglobin A1c 5.7   Tobacco use -- continues to smoke 1 ppd  Follow up arranged in the office 10/31  For questions or updates, please contact [facility] Please consult [email or web address] for contact info under  Signed, Laverda Page, NP

## 2023-09-25 NOTE — Hospital Course (Signed)
Randy Webb. is a 62 y.o. male with a history of nocturnal hypoxia on 2 L, CAD status post PCI with DES in January 2024 on dual antiplatelet therapy, COPD, tobacco use, hyperlipidemia, ischemic cardiomyopathy with LVEF of 35%, hypothyroidism, diabetes mellitus type 2, GERD.  Patient presented secondary to chest pain.  Cardiology consulted and performed left heart catheterization which showed evidence of stable coronary artery disease.  Patient started on Imdur for symptom management.  Patient developed a mild AKI post cardiac catheterization with stabilization of creatinine prior to discharge.

## 2023-09-25 NOTE — Progress Notes (Signed)
Patient had a 4 beat run of non-sustained vtach. Patient is asymptomatic at this time.

## 2023-09-26 ENCOUNTER — Ambulatory Visit: Payer: Medicaid Other | Admitting: Cardiology
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
## 2023-10-02 ENCOUNTER — Ambulatory Visit: Payer: Medicaid Other | Attending: Cardiology | Admitting: Cardiology

## 2023-10-03 ENCOUNTER — Emergency Department (HOSPITAL_COMMUNITY)
Admission: EM | Admit: 2023-10-03 | Discharge: 2023-10-04 | Disposition: A | Discharge status: Home / Self Care | Payer: Medicaid Other | Attending: Emergency Medicine | Admitting: Emergency Medicine

## 2023-10-03 ENCOUNTER — Encounter (HOSPITAL_COMMUNITY): Payer: Self-pay

## 2023-10-03 ENCOUNTER — Other Ambulatory Visit: Payer: Self-pay

## 2023-10-03 ENCOUNTER — Emergency Department (HOSPITAL_COMMUNITY): Payer: Medicaid Other

## 2023-10-03 DIAGNOSIS — Z7901 Long term (current) use of anticoagulants: Secondary | ICD-10-CM | POA: Diagnosis not present

## 2023-10-03 DIAGNOSIS — R000 Tachycardia, unspecified: Secondary | ICD-10-CM | POA: Diagnosis present

## 2023-10-03 DIAGNOSIS — I4891 Unspecified atrial fibrillation: Secondary | ICD-10-CM | POA: Diagnosis not present

## 2023-10-03 DIAGNOSIS — I2510 Atherosclerotic heart disease of native coronary artery without angina pectoris: Secondary | ICD-10-CM | POA: Diagnosis not present

## 2023-10-03 LAB — BASIC METABOLIC PANEL
Anion gap: 12 (ref 5–15)
BUN: 18 mg/dL (ref 8–23)
CO2: 24 mmol/L (ref 22–32)
Calcium: 9.7 mg/dL (ref 8.9–10.3)
Chloride: 100 mmol/L (ref 98–111)
Creatinine, Ser: 1.31 mg/dL — ABNORMAL HIGH (ref 0.61–1.24)
GFR, Estimated: >60 mL/min (ref >60)
Glucose, Bld: 88 mg/dL (ref 70–99)
Potassium: 3.3 mmol/L — ABNORMAL LOW (ref 3.5–5.1)
Sodium: 136 mmol/L (ref 135–145)

## 2023-10-03 LAB — CBC
HCT: 39.9 % (ref 39.0–52.0)
Hemoglobin: 13.1 g/dL (ref 13.0–17.0)
MCH: 27.8 pg (ref 26.0–34.0)
MCHC: 32.8 g/dL (ref 30.0–36.0)
MCV: 84.5 fL (ref 80.0–100.0)
Platelets: 230 10*3/uL (ref 150–400)
RBC: 4.72 MIL/uL (ref 4.22–5.81)
RDW: 15.2 % (ref 11.5–15.5)
WBC: 9 10*3/uL (ref 4.0–10.5)
nRBC: 0 % (ref 0.0–0.2)

## 2023-10-03 NOTE — ED Provider Notes (Incomplete)
[place] EMERGENCY DEPARTMENT AT [facility] Provider Note   CSN: [id number] Arrival date & time: 10/03/23  2240     History {Add pertinent medical, surgical, social history, OB history to HPI:1} Chief Complaint  Patient presents with   Tachycardia   Chest Pain    Randy Webb. is a 62 y.o. male.  Presents to the emergency department by ambulance for evaluation of chest pain.  Patient reports a history of heart disease and has had recent heart catheterization.  He does have stents.  EMS reports that the patient was in A-fib with rapid ventricular response upon their arrival.  He denies history of A-fib.  Patient given IV Cardizem during transport with improvement of heart rate.  He still complains of pressure in the chest.       Home Medications Prior to Admission medications   Medication Sig Start Date End Date Taking? Authorizing Provider  albuterol (PROVENTIL) (2.5 MG/3ML) 0.083% nebulizer solution Take 3 mLs (2.5 mg total) by nebulization every 4 (four) hours as needed for wheezing or shortness of breath. 12/19/22 12/19/23  Marcelino Duster, PA  aspirin 81 MG chewable tablet Chew 1 tablet (81 mg total) by mouth daily. 12/20/22   Duke, Roe Rutherford, PA  atorvastatin (LIPITOR) 80 MG tablet Take 80 mg by mouth daily. 06/21/15   [provider]  cevimeline (EVOXAC) 30 MG capsule Take 30 mg by mouth 3 (three) times daily. 03/19/21   [provider]  Cholecalciferol (D3-1000) 25 MCG (1000 UT) capsule Take 1,000 Units by mouth daily.    [provider]  clopidogrel (PLAVIX) 75 MG tablet Take 1 tablet (75 mg total) by mouth daily. 09/25/23 10/25/23  Narda Bonds, MD  ezetimibe (ZETIA) 10 MG tablet Take 1 tablet (10 mg total) by mouth daily. 09/01/23   Georgeanna Lea, MD  gabapentin (NEURONTIN) 300 MG capsule Take 1 capsule by mouth 2 (two) times daily. 03/19/21   [provider]  isosorbide mononitrate (IMDUR) 30 MG 24 hr  tablet Take 1 tablet (30 mg total) by mouth daily. 09/25/23 12/24/23  Narda Bonds, MD  magnesium oxide (MAG-OX) 400 (240 Mg) MG tablet Take 1 tablet (400 mg total) by mouth daily. 09/25/23 10/25/23  Narda Bonds, MD  metoprolol succinate (TOPROL-XL) 25 MG 24 hr tablet Take 1 tablet (25 mg total) by mouth daily. 09/25/23 12/24/23  Narda Bonds, MD  nicotine (NICODERM CQ - DOSED IN MG/24 HOURS) 21 mg/24hr patch Place 21 mg onto the skin daily. 03/17/23   [provider]  nitroGLYCERIN (NITROSTAT) 0.4 MG SL tablet Place 1 tablet (0.4 mg total) under the tongue every 5 (five) minutes as needed for chest pain. Patient not taking: Reported on 09/23/2023 12/19/22   Marcelino Duster, PA  oxyCODONE (ROXICODONE) 15 MG immediate release tablet Take 15 mg by mouth every 6 (six) hours. 08/16/19   [provider]  pantoprazole (PROTONIX) 40 MG tablet Take 1 tablet (40 mg total) by mouth daily. 12/20/22   Duke, Roe Rutherford, PA  umeclidinium-vilanterol (ANORO ELLIPTA) 62.5-25 MCG/INH AEPB Inhale 1 puff into the lungs daily.    [provider]      Allergies    Aspirin    Review of Systems   Review of Systems  Physical Exam Updated Vital Signs BP (!) 124/90   Pulse (!) 123   Temp 98 F (36.7 C) (Oral)   Resp 18   Ht 6' (1.829 m)   [employer]  76 kg   SpO2 100%   BMI 22.72 kg/m  Physical Exam Vitals and nursing note reviewed.  Constitutional:      General: He is not in acute distress.    Appearance: He is well-developed.  HENT:     Head: Normocephalic and atraumatic.     Mouth/Throat:     Mouth: Mucous membranes are moist.  Eyes:     General: Vision grossly intact. Gaze aligned appropriately.     Extraocular Movements: Extraocular movements intact.     Conjunctiva/sclera: Conjunctivae normal.  Cardiovascular:     Rate and Rhythm: Normal rate. Rhythm irregularly irregular.     Pulses: Normal pulses.     Heart sounds: Normal heart sounds, S1 normal and S2 normal.  No murmur heard.    No friction rub. No gallop.  Pulmonary:     Effort: Pulmonary effort is normal. No respiratory distress.     Breath sounds: Normal breath sounds.  Abdominal:     Palpations: Abdomen is soft.     Tenderness: There is no abdominal tenderness. There is no guarding or rebound.     Hernia: No hernia is present.  Musculoskeletal:        General: No swelling.     Cervical back: Full passive range of motion without pain, normal range of motion and neck supple. No pain with movement, spinous process tenderness or muscular tenderness. Normal range of motion.     Right lower leg: No edema.     Left lower leg: No edema.  Skin:    General: Skin is warm and dry.     Capillary Refill: Capillary refill takes less than 2 seconds.     Findings: No ecchymosis, erythema, lesion or wound.  Neurological:     Mental Status: He is alert and oriented to person, place, and time.     GCS: GCS eye subscore is 4. GCS verbal subscore is 5. GCS motor subscore is 6.     Cranial Nerves: Cranial nerves 2-12 are intact.     Sensory: Sensation is intact.     Motor: Motor function is intact. No weakness or abnormal muscle tone.     Coordination: Coordination is intact.  Psychiatric:        Mood and Affect: Mood normal.        Speech: Speech normal.        Behavior: Behavior normal.     ED Results / Procedures / Treatments   Labs (all labs ordered are listed, but only abnormal results are displayed) Labs Reviewed  BASIC METABOLIC PANEL - Abnormal; Notable for the following components:      Result Value   Potassium 3.3 (*)    Creatinine, Ser 1.31 (*)    All other components within normal limits  CBC    EKG None  Radiology DG Chest Port 1 View  Result Date: 10/03/2023 CLINICAL DATA:  Atrial fibrillation EXAM: PORTABLE CHEST 1 VIEW COMPARISON:  09/22/2023 FINDINGS: The heart size and mediastinal contours are within normal limits. Both lungs are clear. The visualized skeletal structures  are unremarkable. IMPRESSION: No active disease. Electronically Signed   By: Helyn Numbers M.D.   On: 10/03/2023 23:29    Procedures Procedures  {Document cardiac monitor, telemetry assessment procedure when appropriate:1}  Medications Ordered in ED Medications - No data to display  ED Course/ Medical Decision Making/ A&P   {   Click here for ABCD2, HEART and other calculatorsREFRESH Note before signing :1}  Medical Decision Making Amount and/or Complexity of Data Reviewed External Data Reviewed: notes.    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 Radiology: ordered.   ***  {Document critical care time when appropriate:1} {Document review of labs and clinical decision tools ie heart score, Chads2Vasc2 etc:1}  {Document your independent review of radiology images, and any outside records:1} {Document your discussion with family members, caretakers, and with consultants:1} {Document social determinants of health affecting pt's care:1} {Document your decision making why or why not admission, treatments were needed:1} Final Clinical Impression(s) / ED Diagnoses Final diagnoses:  None    Rx / DC Orders ED Discharge Orders     None

## 2023-10-03 NOTE — ED Triage Notes (Signed)
Pt presents via EMS c/o chest pain and tachycardia. Reports HR 160s a fib RVR per EMS, 10mg  IV cardizem given with improvement in rate and chest pain. 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 EMS reports new onset of the atrial fib.

## 2023-10-04 LAB — TROPONIN I (HIGH SENSITIVITY)
Troponin I (High Sensitivity): 60 ng/L — ABNORMAL HIGH (ref <18)
Troponin I (High Sensitivity): 74 ng/L — ABNORMAL HIGH (ref <18)
Troponin I (High Sensitivity): 80 ng/L — ABNORMAL HIGH (ref <18)

## 2023-10-04 MED ORDER — APIXABAN 5 MG PO TABS: 5.0000 mg | ORAL_TABLET | Freq: Two times a day (BID) | ORAL | 2 refills | Status: DC

## 2023-10-04 MED ORDER — HEPARIN BOLUS VIA INFUSION
3800.0000 [IU] | Freq: Once | INTRAVENOUS | Status: DC
  Filled 2023-10-04: qty 3800

## 2023-10-04 MED ORDER — HEPARIN (PORCINE) 25000 UT/250ML-% IV SOLN: 1000.0000 [IU]/h | INTRAVENOUS | Status: DC

## 2023-10-04 MED ORDER — APIXABAN 5 MG PO TABS
5.0000 mg | ORAL_TABLET | ORAL | Status: AC
  Administered 2023-10-04: 5 mg via ORAL
  Filled 2023-10-04: qty 1

## 2023-10-04 MED ORDER — DILTIAZEM HCL-DEXTROSE 125-5 MG/125ML-% IV SOLN (PREMIX)
5.0000 mg/h | INTRAVENOUS | Status: DC
  Filled 2023-10-04: qty 125

## 2023-10-04 NOTE — Discharge Instructions (Addendum)
STOP taking Aspirin.  Continue taking Plavix.  START taking Eliquis.

## 2023-10-04 NOTE — ED Notes (Signed)
Notified Dr. Blinda Leatherwood patient's repeat troponin 80, no new orders

## 2023-10-04 NOTE — ED Notes (Signed)
Dr. Blinda Leatherwood notified that the patient's HR 65-70 and NSR, EKG obtained.

## 2023-10-18 DIAGNOSIS — I4891 Unspecified atrial fibrillation: Secondary | ICD-10-CM

## 2023-10-18 DIAGNOSIS — I429 Cardiomyopathy, unspecified: Secondary | ICD-10-CM | POA: Insufficient documentation

## 2023-10-18 HISTORY — DX: Cardiomyopathy, unspecified: I42.9

## 2023-10-18 HISTORY — DX: Unspecified atrial fibrillation: I48.91

## 2024-01-23 DIAGNOSIS — J9611 Chronic respiratory failure with hypoxia: Secondary | ICD-10-CM | POA: Insufficient documentation

## 2024-01-23 DIAGNOSIS — Z91199 Patient's noncompliance with other medical treatment and regimen due to unspecified reason: Secondary | ICD-10-CM | POA: Insufficient documentation

## 2024-01-23 HISTORY — DX: Chronic respiratory failure with hypoxia: J96.11

## 2024-01-23 HISTORY — DX: Patient's noncompliance with other medical treatment and regimen due to unspecified reason: Z91.199

## 2024-01-26 ENCOUNTER — Telehealth: Payer: Self-pay | Admitting: Cardiology

## 2024-01-26 NOTE — Telephone Encounter (Signed)
 Appointment made for hospital follow up

## 2024-01-26 NOTE — Telephone Encounter (Signed)
 Pt is requesting a callback to get recommendations for a Pulmonologist. Please advise

## 2024-01-27 ENCOUNTER — Ambulatory Visit: Payer: Medicaid Other

## 2024-01-27 DIAGNOSIS — I350 Nonrheumatic aortic (valve) stenosis: Secondary | ICD-10-CM | POA: Insufficient documentation

## 2024-01-28 ENCOUNTER — Other Ambulatory Visit: Payer: Self-pay

## 2024-01-29 ENCOUNTER — Telehealth: Payer: Self-pay | Admitting: Cardiology

## 2024-01-29 NOTE — Telephone Encounter (Signed)
 Spoke with pt advised to send ref request for inhaler to PCP. Pt agreed and verbalized understanding. He had no further questions.

## 2024-01-29 NOTE — Telephone Encounter (Signed)
*  STAT* If patient is at the pharmacy, call can be transferred to refill team.   1. Which medications need to be refilled? (please list name of each medication and dose if known) tiotropium (SPIRIVA HANDIHALER) 18 MCG inhalation capsule   2. Which pharmacy/location (including street and city if local pharmacy) is medication to be sent to? [facility] [phone] - [place], [place] - [address]   3. Do they need a 30 day or 90 day supply? 90

## 2024-02-02 ENCOUNTER — Ambulatory Visit: Payer: Medicaid Other

## 2024-02-26 ENCOUNTER — Ambulatory Visit: Payer: Medicaid Other | Admitting: Cardiology

## 2024-03-16 ENCOUNTER — Encounter (HOSPITAL_COMMUNITY): Payer: Self-pay

## 2024-03-16 DIAGNOSIS — I509 Heart failure, unspecified: Secondary | ICD-10-CM | POA: Diagnosis not present

## 2024-03-16 DIAGNOSIS — I340 Nonrheumatic mitral (valve) insufficiency: Secondary | ICD-10-CM | POA: Diagnosis not present

## 2024-03-16 DIAGNOSIS — I351 Nonrheumatic aortic (valve) insufficiency: Secondary | ICD-10-CM | POA: Diagnosis not present
